# Patient Record
Sex: Male | Born: 1985
Health system: Southern US, Community
[De-identification: ages and names within clinical notes are randomized; demographics above are authoritative.]

## PROBLEM LIST (undated history)

## (undated) HISTORY — PX: WRIST FUSION: SHX839

---
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
## 2009-08-19 ENCOUNTER — Inpatient Hospital Stay (HOSPITAL_COMMUNITY)
Admission: EM | Admit: 2009-08-19 | Discharge: 2009-08-21 | Payer: Self-pay | Source: Home / Self Care | Admitting: Emergency Medicine

## 2010-04-15 LAB — BASIC METABOLIC PANEL
CO2: 28 mEq/L (ref 19–32)
Chloride: 101 mEq/L (ref 96–112)
GFR calc Af Amer: >60 mL/min (ref >60)
GFR calc non Af Amer: >60 mL/min (ref >60)
Sodium: 135 mEq/L (ref 135–145)

## 2010-04-15 LAB — CBC
HCT: 37.1 % — ABNORMAL LOW (ref 39.0–52.0)
HCT: 37.4 % — ABNORMAL LOW (ref 39.0–52.0)
Hemoglobin: 12.8 g/dL — ABNORMAL LOW (ref 13.0–17.0)
Hemoglobin: 12.9 g/dL — ABNORMAL LOW (ref 13.0–17.0)
Hemoglobin: 15.2 g/dL (ref 13.0–17.0)
MCH: 30.3 pg (ref 26.0–34.0)
MCH: 30.3 pg (ref 26.0–34.0)
MCH: 31 pg (ref 26.0–34.0)
MCHC: 34.3 g/dL (ref 30.0–36.0)
MCHC: 34.3 g/dL (ref 30.0–36.0)
MCHC: 34.9 g/dL (ref 30.0–36.0)
MCV: 88.4 fL (ref 78.0–100.0)
Platelets: 187 10*3/uL (ref 150–400)
RBC: 4.24 MIL/uL (ref 4.22–5.81)
RDW: 12.2 % (ref 11.5–15.5)
RDW: 12.4 % (ref 11.5–15.5)
RDW: 12.4 % (ref 11.5–15.5)

## 2010-04-15 LAB — COMPREHENSIVE METABOLIC PANEL
ALT: 178 U/L — ABNORMAL HIGH (ref 0–53)
CO2: 24 mEq/L (ref 19–32)
Calcium: 9.2 mg/dL (ref 8.4–10.5)
Creatinine, Ser: 1.07 mg/dL (ref 0.4–1.5)
Total Protein: 7.4 g/dL (ref 6.0–8.3)

## 2010-04-15 LAB — LACTIC ACID, PLASMA: Lactic Acid, Venous: 2.9 mmol/L — ABNORMAL HIGH (ref 0.5–2.2)

## 2010-04-15 LAB — SAMPLE TO BLOOD BANK

## 2010-04-15 LAB — POCT I-STAT, CHEM 8
Calcium, Ion: 1.1 mmol/L — ABNORMAL LOW (ref 1.12–1.32)
Glucose, Bld: 163 mg/dL — ABNORMAL HIGH (ref 70–99)
Potassium: 3.6 mEq/L (ref 3.5–5.1)
TCO2: 20 mmol/L (ref 0–100)

## 2010-04-15 LAB — PROTIME-INR: INR: 1.26 (ref 0.00–1.49)

## 2010-04-15 LAB — APTT: aPTT: 27 seconds (ref 24–37)
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
## 2012-03-07 ENCOUNTER — Other Ambulatory Visit (HOSPITAL_BASED_OUTPATIENT_CLINIC_OR_DEPARTMENT_OTHER): Payer: Self-pay | Admitting: Family Medicine

## 2012-03-07 DIAGNOSIS — R229 Localized swelling, mass and lump, unspecified: Secondary | ICD-10-CM

## 2012-03-10 ENCOUNTER — Ambulatory Visit (HOSPITAL_BASED_OUTPATIENT_CLINIC_OR_DEPARTMENT_OTHER): Payer: BC Managed Care – PPO

## 2012-04-10 ENCOUNTER — Other Ambulatory Visit (HOSPITAL_BASED_OUTPATIENT_CLINIC_OR_DEPARTMENT_OTHER): Payer: Self-pay | Admitting: Family Medicine

## 2012-04-10 DIAGNOSIS — R229 Localized swelling, mass and lump, unspecified: Secondary | ICD-10-CM

## 2012-04-11 ENCOUNTER — Ambulatory Visit (HOSPITAL_BASED_OUTPATIENT_CLINIC_OR_DEPARTMENT_OTHER)
Admission: RE | Admit: 2012-04-11 | Discharge: 2012-04-11 | Disposition: A | Discharge status: Home / Self Care | Payer: BC Managed Care – PPO | Source: Ambulatory Visit | Attending: Family Medicine | Admitting: Family Medicine

## 2012-04-11 DIAGNOSIS — R229 Localized swelling, mass and lump, unspecified: Secondary | ICD-10-CM | POA: Insufficient documentation

## 2015-04-12 ENCOUNTER — Other Ambulatory Visit: Payer: Self-pay | Admitting: Physician Assistant

## 2015-04-12 DIAGNOSIS — R590 Localized enlarged lymph nodes: Secondary | ICD-10-CM

## 2019-07-17 ENCOUNTER — Other Ambulatory Visit: Payer: Self-pay

## 2019-07-17 ENCOUNTER — Encounter (HOSPITAL_BASED_OUTPATIENT_CLINIC_OR_DEPARTMENT_OTHER): Payer: Self-pay | Admitting: *Deleted

## 2019-07-17 ENCOUNTER — Emergency Department (HOSPITAL_BASED_OUTPATIENT_CLINIC_OR_DEPARTMENT_OTHER): Payer: Self-pay

## 2019-07-17 ENCOUNTER — Emergency Department (HOSPITAL_BASED_OUTPATIENT_CLINIC_OR_DEPARTMENT_OTHER)
Admission: EM | Admit: 2019-07-17 | Discharge: 2019-07-17 | Disposition: A | Discharge status: Home / Self Care | Payer: Self-pay | Attending: Emergency Medicine | Admitting: Emergency Medicine

## 2019-07-17 DIAGNOSIS — I10 Essential (primary) hypertension: Secondary | ICD-10-CM | POA: Insufficient documentation

## 2019-07-17 DIAGNOSIS — R059 Cough, unspecified: Secondary | ICD-10-CM

## 2019-07-17 DIAGNOSIS — I160 Hypertensive urgency: Secondary | ICD-10-CM | POA: Insufficient documentation

## 2019-07-17 DIAGNOSIS — G43009 Migraine without aura, not intractable, without status migrainosus: Secondary | ICD-10-CM | POA: Insufficient documentation

## 2019-07-17 DIAGNOSIS — N179 Acute kidney failure, unspecified: Secondary | ICD-10-CM | POA: Insufficient documentation

## 2019-07-17 LAB — CBC WITH DIFFERENTIAL/PLATELET
Abs Immature Granulocytes: 0.01 10*3/uL (ref 0.00–0.07)
Basophils Absolute: 0 10*3/uL (ref 0.0–0.1)
Basophils Relative: 1 %
Eosinophils Absolute: 0.2 10*3/uL (ref 0.0–0.5)
Eosinophils Relative: 3 %
HCT: 35.5 % — ABNORMAL LOW (ref 39.0–52.0)
Hemoglobin: 12.1 g/dL — ABNORMAL LOW (ref 13.0–17.0)
Immature Granulocytes: 0 %
Lymphocytes Relative: 23 %
Lymphs Abs: 1.2 10*3/uL (ref 0.7–4.0)
MCH: 27.1 pg (ref 26.0–34.0)
MCHC: 34.1 g/dL (ref 30.0–36.0)
MCV: 79.6 fL — ABNORMAL LOW (ref 80.0–100.0)
Monocytes Absolute: 0.6 10*3/uL (ref 0.1–1.0)
Monocytes Relative: 11 %
Neutro Abs: 3.3 10*3/uL (ref 1.7–7.7)
Neutrophils Relative %: 62 %
Platelets: 233 10*3/uL (ref 150–400)
RBC: 4.46 MIL/uL (ref 4.22–5.81)
RDW: 14.6 % (ref 11.5–15.5)
WBC: 5.3 10*3/uL (ref 4.0–10.5)
nRBC: 0 % (ref 0.0–0.2)

## 2019-07-17 LAB — COMPREHENSIVE METABOLIC PANEL
ALT: 22 U/L (ref 0–44)
AST: 22 U/L (ref 15–41)
Albumin: 4.2 g/dL (ref 3.5–5.0)
Alkaline Phosphatase: 158 U/L — ABNORMAL HIGH (ref 38–126)
Anion gap: 11 (ref 5–15)
BUN: 18 mg/dL (ref 6–20)
CO2: 24 mmol/L (ref 22–32)
Calcium: 12.5 mg/dL — ABNORMAL HIGH (ref 8.9–10.3)
Chloride: 103 mmol/L (ref 98–111)
Creatinine, Ser: 2.37 mg/dL — ABNORMAL HIGH (ref 0.61–1.24)
GFR calc Af Amer: 40 mL/min — ABNORMAL LOW (ref >60)
GFR calc non Af Amer: 34 mL/min — ABNORMAL LOW (ref >60)
Glucose, Bld: 90 mg/dL (ref 70–99)
Potassium: 3.6 mmol/L (ref 3.5–5.1)
Sodium: 138 mmol/L (ref 135–145)
Total Bilirubin: 0.6 mg/dL (ref 0.3–1.2)
Total Protein: 9.8 g/dL — ABNORMAL HIGH (ref 6.5–8.1)

## 2019-07-17 LAB — VITAMIN D 25 HYDROXY (VIT D DEFICIENCY, FRACTURES): Vit D, 25-Hydroxy: 21.09 ng/mL — ABNORMAL LOW (ref 30–100)

## 2019-07-17 LAB — URINALYSIS, ROUTINE W REFLEX MICROSCOPIC
Bilirubin Urine: NEGATIVE
Glucose, UA: NEGATIVE mg/dL
Hgb urine dipstick: NEGATIVE
Ketones, ur: NEGATIVE mg/dL
Leukocytes,Ua: NEGATIVE
Nitrite: NEGATIVE
Protein, ur: NEGATIVE mg/dL
Specific Gravity, Urine: 1.01 (ref 1.005–1.030)
pH: 7 (ref 5.0–8.0)

## 2019-07-17 LAB — GAMMA GT: GGT: 80 U/L — ABNORMAL HIGH (ref 7–50)

## 2019-07-17 LAB — TROPONIN I (HIGH SENSITIVITY): Troponin I (High Sensitivity): 7 ng/L (ref <18)

## 2019-07-17 MED ORDER — LABETALOL HCL 5 MG/ML IV SOLN
10.0000 mg | Freq: Once | INTRAVENOUS | Status: AC
  Administered 2019-07-17: 10 mg via INTRAVENOUS
  Filled 2019-07-17: qty 4

## 2019-07-17 MED ORDER — AMLODIPINE BESYLATE 5 MG PO TABS
5.0000 mg | ORAL_TABLET | Freq: Every day | ORAL | 0 refills | Status: DC
Start: 2019-07-17 — End: 2019-07-22

## 2019-07-17 NOTE — Discharge Instructions (Addendum)
It was wonderful to meet you today.  You were seen in the ED due to significantly elevated blood pressure, high calcium, and kidney injury.  Your CT head was normal and your chest x-ray shows some thickening of your airways in the center on both sides.  We do still recommend that you be admitted for further evaluation and work-up.  Please make sure you follow-up with your PCP on Monday to continue further evaluation.  We have sent in a blood pressure medication, please make sure that you take this daily.  Also check your blood pressure 1-2 times daily over the weekend and keep a journal of this.  If you have any shortness of breath, chest pain, headache not improved with Tylenol, lightheadedness/dizziness, muscle spasms, decreased urine output, or any weakness/numbness please make sure you return to evaluation sooner than your follow-up.  Please avoid any NSAIDs including ibuprofen, Motrin, Aleve.  Stop taking the hydrochlorothiazide. Stay hydrated.

## 2019-07-17 NOTE — ED Triage Notes (Signed)
Pt sent here by PMD office with abnormal labs from this am. C/o generalized fatigue and cough .

## 2019-07-17 NOTE — ED Notes (Signed)
Patient transported to CT 

## 2019-07-17 NOTE — ED Provider Notes (Signed)
[facility] EMERGENCY DEPARTMENT Provider Note   CSN: [id number] Arrival date & time: 07/17/19  1822     History Chief Complaint  Patient presents with  . Hypertension    Phillip Kim is a 34 y.o. male presenting for evaluation of abnormal labs after being seen by his primary care provider yesterday morning.  Labs collected this morning.  Reports he was informed of new anemia, renal dysfunction, and calcium abnormalities.  Reports an approximate 1 month history of generalized fatigue and malaise with frequent right-side migraines.  He had also had an occasionally productive cough since January that has improved recently, of minimal concern to him now.  He has been having about 3 or so migraines a week, the at the end of the day and throbbing in nature.  Associated nausea, photophobia, phonophobia if he does not take ibuprofen quickly.  He has been taking ibuprofen 400 mg numerous days a week for the past month.  For the last 2 weeks he has noticed bilateral ankle swelling.  Urinating as normal several times a day.  He was seen by his primary care provider for these concerns yesterday.  His blood pressure was significantly elevated that time as well.  Not previously diagnosed with hypertension and had not checked his blood pressure in several months prior to.  He denies any chest pain, shortness of breath, visual changes, numbness/tingling, muscle spasms, easy bruising, unexpected weight loss, or fever.  Has been having bilateral elbow joint pains.  No known family history of renal disease or malignancy.  Non-smoker and occasionally drinks alcohol.  Denies any illicit drug use.  Sexually active in a monogamous marriage.    History reviewed. No pertinent past medical history.  There are no problems to display for this patient.   Past Surgical History:  Procedure Laterality Date  . WRIST FUSION         History reviewed. No pertinent family history.  Social History    Tobacco Use  . Smoking status: Never Smoker  . Smokeless tobacco: Never Used  Vaping Use  . Vaping Use: Never used  Substance Use Topics  . Alcohol use: Not Currently    Comment: soc  . Drug use: Not Currently    Home Medications Prior to Admission medications   Medication Sig Start Date End Date Taking? Authorizing Provider  amLODipine (NORVASC) 5 MG tablet Take 1 tablet (5 mg total) by mouth daily. Can increase in the 2-3 times to two tablets. 07/17/19 08/16/19  Allayne Stack, DO    Allergies    Patient has no known allergies.  Review of Systems   Review of Systems  Respiratory: Positive for cough. Negative for chest tightness and shortness of breath.   Cardiovascular: Positive for leg swelling. Negative for chest pain and palpitations.  Gastrointestinal: Negative for abdominal pain, blood in stool, constipation, diarrhea and vomiting.  Genitourinary: Negative for dysuria and hematuria.  Musculoskeletal: Positive for arthralgias.  Neurological: Positive for headaches. Negative for dizziness, seizures, syncope, weakness, light-headedness and numbness.  Hematological: Negative for adenopathy. Does not bruise/bleed easily.  Psychiatric/Behavioral: Negative for behavioral problems.    Physical Exam Updated Vital Signs BP (!) 169/120 (BP Location: Right Arm)   Pulse 83   Temp 98.6 F (37 C) (Oral)   Resp 14   Ht 6\' 2"  (1.88 m)   Wt 78.9 kg   SpO2 99%   BMI 22.34 kg/m   Physical Exam Constitutional:      General: He is not in  acute distress.    Appearance: Normal appearance. He is not toxic-appearing.  HENT:     Head: Normocephalic and atraumatic.     Mouth/Throat:     Mouth: Mucous membranes are moist.  Eyes:     General: No scleral icterus.    Extraocular Movements: Extraocular movements intact.     Pupils: Pupils are equal, round, and reactive to light.  Neck:     Comments: Normal-sized thyroid without palpation of nodules.  Nontender to palpation of  thyroid. Cardiovascular:     Rate and Rhythm: Normal rate and regular rhythm.     Pulses: Normal pulses.     Heart sounds: Normal heart sounds. No murmur heard.  No friction rub.     Comments: No supraclavicular or axillary lymphadenopathy bilaterally. Pulmonary:     Effort: Pulmonary effort is normal.     Breath sounds: Normal breath sounds. No wheezing, rhonchi or rales.  Abdominal:     General: Bowel sounds are normal. There is no distension.     Palpations: Abdomen is soft.     Tenderness: There is no abdominal tenderness. There is no guarding or rebound.  Musculoskeletal:        General: Normal range of motion.     Cervical back: Normal range of motion and neck supple.     Comments: Trace pedal edema bilaterally.  Lymphadenopathy:     Cervical: No cervical adenopathy.  Skin:    General: Skin is warm and dry.     Capillary Refill: Capillary refill takes less than 2 seconds.     Coloration: Skin is not jaundiced.     Findings: No bruising or rash.  Neurological:     General: No focal deficit present.     Mental Status: He is alert and oriented to person, place, and time.  Psychiatric:        Mood and Affect: Mood normal.        Behavior: Behavior normal.     ED Results / Procedures / Treatments   Labs (all labs ordered are listed, but only abnormal results are displayed) Labs Reviewed  CBC WITH DIFFERENTIAL/PLATELET - Abnormal; Notable for the following components:      Result Value   Hemoglobin 12.1 (*)    HCT 35.5 (*)    MCV 79.6 (*)    All other components within normal limits  COMPREHENSIVE METABOLIC PANEL - Abnormal; Notable for the following components:   Creatinine, Ser 2.37 (*)    Calcium 12.5 (*)    Total Protein 9.8 (*)    Alkaline Phosphatase 158 (*)    GFR calc non Af Amer 34 (*)    GFR calc Af Amer 40 (*)    All other components within normal limits  VITAMIN D 25 HYDROXY (VIT D DEFICIENCY, FRACTURES) - Abnormal; Notable for the following components:    Vit D, 25-Hydroxy 21.09 (*)    All other components within normal limits  GAMMA GT - Abnormal; Notable for the following components:   GGT 80 (*)    All other components within normal limits  URINALYSIS, ROUTINE W REFLEX MICROSCOPIC  PARATHYROID HORMONE, INTACT (NO CA)  TROPONIN I (HIGH SENSITIVITY)    EKG EKG Interpretation  Date/Time:  Friday July 17 2019 18:52:53 EDT Ventricular Rate:  93 PR Interval:    QRS Duration: 94 QT Interval:  356 QTC Calculation: 443 R Axis:   78 Text Interpretation: Sinus rhythm Consider left ventricular hypertrophy no prior available for comparison Confirmed by Ralene Bathe,  Lanora Manis [id number]) on 07/17/2019 7:35:18 PM   Radiology DG Chest 2 View  Result Date: 07/17/2019 CLINICAL DATA:  Fatigue and cough. Referred by primary care for abnormal labs. EXAM: CHEST - 2 VIEW COMPARISON:  Radiograph 09/18/2017 FINDINGS: The cardiomediastinal contours are normal. Diffuse peribronchial thickening that is similar to 2019 exam. Pulmonary vasculature is normal. No consolidation, pleural effusion, or pneumothorax. No acute osseous abnormalities are seen. IMPRESSION: Diffuse peribronchial thickening that is chronic and similar to 2019 exam, underlying etiology is uncertain. No new findings. Electronically Signed   By: Narda Rutherford M.D.   On: 07/17/2019 20:13   CT Head Wo Contrast  Result Date: 07/17/2019 CLINICAL DATA:  Intermittent headache for 3 weeks with right eye visual disturbance. EXAM: CT HEAD WITHOUT CONTRAST TECHNIQUE: Contiguous axial images were obtained from the base of the skull through the vertex without intravenous contrast. COMPARISON:  08/20/2009 FINDINGS: Brain: There is no evidence of acute infarct, intracranial hemorrhage, mass, midline shift, or extra-axial fluid collection. The ventricles and sulci are normal. Small subdural hematoma over the tentorium on the 2011 study has resolved. Vascular: No hyperdense vessel. Skull: No fracture or suspicious  osseous lesion. Sinuses/Orbits: Moderate bilateral ethmoid air cell mucosal thickening. Clear mastoid air cells. Unremarkable orbits. Other: None. IMPRESSION: Unremarkable CT appearance of the brain. Electronically Signed   By: Sebastian Ache M.D.   On: 07/17/2019 20:11    Procedures Procedures (including critical care time)  Medications Ordered in ED Medications  labetalol (NORMODYNE) injection 10 mg (10 mg Intravenous Given 07/17/19 2026)    ED Course  I have reviewed the triage vital signs and the nursing notes.  Pertinent labs & imaging results that were available during my care of the patient were reviewed by me and considered in my medical decision making (see chart for details).    MDM Rules/Calculators/A&P                          34 year old gentleman presenting for evaluation of abnormal labs via PCP earlier today, associated with several week history of fatigue and migraines.   Work-up notable for asymptomatic moderate hypercalcemia of 12.5, creatinine 2.37 with unknown baseline, isolated elevated alkaline phosphatase of 158, and mild microcytic anemia.  Renal injury is nonoliguric without evidence of hematuria or proteinuria on U/A to suggest nephritic or nephrotic process.  Notably hypertensive with systolics >180 and diastolics >120, do suspect secondary hypertension.  Received IV labetalol with improvement in BP.  CT head without acute intracranial abnormality.  CXR with peribronchial thickening otherwise no evidence of pneumonia or masslike structure.  EKG sinus with LVH, suggesting hypertension may be more longstanding than suggested.  Due to hypertensive urgency with moderate hypercalcemia and extensive nonoliguric renal injury, recommended admission for further work-up and monitoring. Patient declined and stated that he would follow-up with his PCP on Monday.  Malignancy, hyperparathyroid, renal disease, sarcoid could be considered. Instructed to discontinue HCTZ d/t AKI and  provided with Norvasc prescription.  Monitor BP 1-2 times daily at home and keep a journal.  Avoid all NSAIDs, Tylenol for headaches.   Added GGT, PTH, and vitamin D to work-up, results returned after patient discharged and will call to inform of results tomorrow, 6/19.  GGT elevated, should consider RUQ U/S for further eval.   Final Clinical Impression(s) / ED Diagnoses Final diagnoses:  Hypertensive urgency  AKI (acute kidney injury) (HCC)  Migraine without aura and without status migrainosus, not intractable    Rx /  DC Orders ED Discharge Orders         Ordered    amLODipine (NORVASC) 5 MG tablet  Daily     Discontinue  Reprint     07/17/19 2200           Allayne Stack, DO 07/18/19 [id number]    Tilden Fossa, MD 07/18/19 1120

## 2019-07-18 ENCOUNTER — Telehealth (HOSPITAL_COMMUNITY): Payer: Self-pay | Admitting: Family Medicine

## 2019-07-18 ENCOUNTER — Other Ambulatory Visit: Payer: Self-pay

## 2019-07-18 ENCOUNTER — Encounter (HOSPITAL_COMMUNITY): Payer: Self-pay | Admitting: Emergency Medicine

## 2019-07-18 ENCOUNTER — Inpatient Hospital Stay (HOSPITAL_COMMUNITY): Payer: Self-pay

## 2019-07-18 ENCOUNTER — Inpatient Hospital Stay (HOSPITAL_COMMUNITY)
Admission: EM | Admit: 2019-07-18 | Discharge: 2019-07-22 | DRG: 683 | Disposition: A | Discharge status: Home / Self Care | Payer: Self-pay | Attending: Internal Medicine | Admitting: Internal Medicine

## 2019-07-18 DIAGNOSIS — I5032 Chronic diastolic (congestive) heart failure: Secondary | ICD-10-CM | POA: Diagnosis present

## 2019-07-18 DIAGNOSIS — I10 Essential (primary) hypertension: Secondary | ICD-10-CM | POA: Diagnosis present

## 2019-07-18 DIAGNOSIS — I110 Hypertensive heart disease with heart failure: Secondary | ICD-10-CM | POA: Diagnosis present

## 2019-07-18 DIAGNOSIS — E876 Hypokalemia: Secondary | ICD-10-CM | POA: Diagnosis present

## 2019-07-18 DIAGNOSIS — R161 Splenomegaly, not elsewhere classified: Secondary | ICD-10-CM | POA: Diagnosis present

## 2019-07-18 DIAGNOSIS — I161 Hypertensive emergency: Secondary | ICD-10-CM | POA: Diagnosis present

## 2019-07-18 DIAGNOSIS — Z20822 Contact with and (suspected) exposure to covid-19: Secondary | ICD-10-CM | POA: Diagnosis present

## 2019-07-18 DIAGNOSIS — D649 Anemia, unspecified: Secondary | ICD-10-CM | POA: Diagnosis present

## 2019-07-18 DIAGNOSIS — I160 Hypertensive urgency: Secondary | ICD-10-CM | POA: Diagnosis present

## 2019-07-18 DIAGNOSIS — N179 Acute kidney failure, unspecified: Principal | ICD-10-CM | POA: Diagnosis present

## 2019-07-18 DIAGNOSIS — R519 Headache, unspecified: Secondary | ICD-10-CM

## 2019-07-18 DIAGNOSIS — R7989 Other specified abnormal findings of blood chemistry: Secondary | ICD-10-CM

## 2019-07-18 DIAGNOSIS — R918 Other nonspecific abnormal finding of lung field: Secondary | ICD-10-CM | POA: Diagnosis present

## 2019-07-18 LAB — CBC WITH DIFFERENTIAL/PLATELET
Abs Immature Granulocytes: 0.02 10*3/uL (ref 0.00–0.07)
Basophils Absolute: 0 10*3/uL (ref 0.0–0.1)
Basophils Relative: 1 %
Eosinophils Absolute: 0.1 10*3/uL (ref 0.0–0.5)
Eosinophils Relative: 2 %
HCT: 35.3 % — ABNORMAL LOW (ref 39.0–52.0)
Hemoglobin: 12 g/dL — ABNORMAL LOW (ref 13.0–17.0)
Immature Granulocytes: 0 %
Lymphocytes Relative: 16 %
Lymphs Abs: 0.8 10*3/uL (ref 0.7–4.0)
MCH: 27.1 pg (ref 26.0–34.0)
MCHC: 34 g/dL (ref 30.0–36.0)
MCV: 79.9 fL — ABNORMAL LOW (ref 80.0–100.0)
Monocytes Absolute: 0.5 10*3/uL (ref 0.1–1.0)
Monocytes Relative: 9 %
Neutro Abs: 3.6 10*3/uL (ref 1.7–7.7)
Neutrophils Relative %: 72 %
Platelets: 232 10*3/uL (ref 150–400)
RBC: 4.42 MIL/uL (ref 4.22–5.81)
RDW: 14.7 % (ref 11.5–15.5)
WBC: 5 10*3/uL (ref 4.0–10.5)
nRBC: 0 % (ref 0.0–0.2)

## 2019-07-18 LAB — BASIC METABOLIC PANEL
Anion gap: 6 (ref 5–15)
BUN: 17 mg/dL (ref 6–20)
CO2: 27 mmol/L (ref 22–32)
Calcium: 13.5 mg/dL (ref 8.9–10.3)
Chloride: 106 mmol/L (ref 98–111)
Creatinine, Ser: 2.45 mg/dL — ABNORMAL HIGH (ref 0.61–1.24)
GFR calc Af Amer: 38 mL/min — ABNORMAL LOW (ref >60)
GFR calc non Af Amer: 33 mL/min — ABNORMAL LOW (ref >60)
Glucose, Bld: 95 mg/dL (ref 70–99)
Potassium: 3 mmol/L — ABNORMAL LOW (ref 3.5–5.1)
Sodium: 139 mmol/L (ref 135–145)

## 2019-07-18 LAB — SARS CORONAVIRUS 2 BY RT PCR (HOSPITAL ORDER, PERFORMED IN ~~LOC~~ HOSPITAL LAB): SARS Coronavirus 2: NEGATIVE

## 2019-07-18 MED ORDER — ACETAMINOPHEN 325 MG PO TABS
650.0000 mg | ORAL_TABLET | Freq: Four times a day (QID) | ORAL | Status: DC | PRN
  Administered 2019-07-18: 650 mg via ORAL
  Filled 2019-07-18: qty 2

## 2019-07-18 MED ORDER — HYDROMORPHONE HCL 1 MG/ML IJ SOLN
0.5000 mg | INTRAMUSCULAR | Status: DC | PRN
  Filled 2019-07-18: qty 1

## 2019-07-18 MED ORDER — LABETALOL HCL 5 MG/ML IV SOLN
10.0000 mg | Freq: Once | INTRAVENOUS | Status: AC
  Administered 2019-07-18: 10 mg via INTRAVENOUS
  Filled 2019-07-18: qty 4

## 2019-07-18 MED ORDER — HYDRALAZINE HCL 25 MG PO TABS
25.0000 mg | ORAL_TABLET | Freq: Four times a day (QID) | ORAL | Status: DC
  Administered 2019-07-18 – 2019-07-21 (×11): 25 mg via ORAL
  Filled 2019-07-18 (×12): qty 1

## 2019-07-18 MED ORDER — POTASSIUM CHLORIDE CRYS ER 20 MEQ PO TBCR
40.0000 meq | EXTENDED_RELEASE_TABLET | Freq: Once | ORAL | Status: AC
  Administered 2019-07-18: 40 meq via ORAL
  Filled 2019-07-18: qty 2

## 2019-07-18 MED ORDER — SODIUM CHLORIDE 0.9 % IV SOLN: INTRAVENOUS | Status: DC

## 2019-07-18 MED ORDER — CARVEDILOL 3.125 MG PO TABS
3.1250 mg | ORAL_TABLET | Freq: Two times a day (BID) | ORAL | Status: DC
  Filled 2019-07-18: qty 1

## 2019-07-18 MED ORDER — CARVEDILOL 3.125 MG PO TABS
3.1250 mg | ORAL_TABLET | Freq: Two times a day (BID) | ORAL | Status: DC
  Administered 2019-07-18 – 2019-07-19 (×3): 3.125 mg via ORAL
  Filled 2019-07-18 (×3): qty 1

## 2019-07-18 MED ORDER — HYDRALAZINE HCL 25 MG PO TABS
25.0000 mg | ORAL_TABLET | Freq: Four times a day (QID) | ORAL | Status: DC | PRN
  Administered 2019-07-19: 25 mg via ORAL

## 2019-07-18 MED ORDER — BUTALBITAL-APAP-CAFFEINE 50-325-40 MG PO TABS
1.0000 | ORAL_TABLET | Freq: Four times a day (QID) | ORAL | Status: DC | PRN
  Administered 2019-07-19 – 2019-07-20 (×4): 1 via ORAL
  Filled 2019-07-18 (×6): qty 1

## 2019-07-18 MED ORDER — SODIUM CHLORIDE 0.9 % IV SOLN: INTRAVENOUS | Status: AC

## 2019-07-18 MED ORDER — AMLODIPINE BESYLATE 5 MG PO TABS
5.0000 mg | ORAL_TABLET | Freq: Two times a day (BID) | ORAL | Status: DC
  Administered 2019-07-18 – 2019-07-22 (×8): 5 mg via ORAL
  Filled 2019-07-18 (×8): qty 1

## 2019-07-18 MED ORDER — ONDANSETRON HCL 4 MG/2ML IJ SOLN
4.0000 mg | Freq: Once | INTRAMUSCULAR | Status: AC
  Administered 2019-07-18: 4 mg via INTRAVENOUS
  Filled 2019-07-18: qty 2

## 2019-07-18 NOTE — ED Provider Notes (Signed)
[place] EMERGENCY DEPARTMENT Provider Note   CSN: [id number] Arrival date & time: 07/18/19  1347     History Chief Complaint  Patient presents with  . Hypertension    Phillip Kim is a 34 y.o. male.  HPI   Patient presents to the emergency department with chief complaint of headaches nausea and vomiting.  Patient states this symptom has been going on for the last 3 weeks patient admits to some epigastric pain, intermittent cough with some phlegm. he was  seen at [facility] last night where he was worked up for hypertensive urgency.  Patient had labs drawn showing elevated creatinine levels, decreased GFR, increased calcium, increased alk phos, and a decreased vitamin D level.  Imaging was performed showing normal head CT as well as a chest x-ray which showed peribronchial thickening similar to x-ray done 2019.  Patient was going to be admitted to the hospital last night but patient left AMA and was instructed to come back if symptoms progress.  Patient is here because symptoms have gotten worse and he would like to be reevaluated.  Patient has no significant medical history, does not take any medication on a daily basis.  Patient denies the use of drugs, alcohol, smoking.  History reviewed. No pertinent past medical history.  There are no problems to display for this patient.   Past Surgical History:  Procedure Laterality Date  . WRIST FUSION         No family history on file.  Social History   Tobacco Use  . Smoking status: Never Smoker  . Smokeless tobacco: Never Used  Vaping Use  . Vaping Use: Never used  Substance Use Topics  . Alcohol use: Not Currently    Comment: soc  . Drug use: Not Currently    Home Medications Prior to Admission medications   Medication Sig Start Date End Date Taking? Authorizing Provider  amLODipine (NORVASC) 5 MG tablet Take 1 tablet (5 mg total) by mouth daily. Can increase in the 2-3 times to two  tablets. 07/17/19 08/16/19  Patriciaann Clan, DO    Allergies    Patient has no known allergies.  Review of Systems   Review of Systems  Constitutional: Negative for chills and fever.  HENT: Negative for congestion and sore throat.   Eyes: Negative for pain and visual disturbance.  Respiratory: Positive for cough. Negative for shortness of breath.   Cardiovascular: Negative for chest pain.  Gastrointestinal: Positive for abdominal pain, nausea and vomiting. Negative for diarrhea.  Genitourinary: Negative for enuresis, flank pain, frequency and testicular pain.  Musculoskeletal: Negative for back pain.  Skin: Negative for rash.  Neurological: Positive for headaches. Negative for dizziness.  Hematological: Does not bruise/bleed easily.    Physical Exam Updated Vital Signs BP (!) 173/125 (BP Location: Right Arm)   Pulse 96   Temp 98.4 F (36.9 C) (Oral)   Resp 16   SpO2 98%   Physical Exam Vitals and nursing note reviewed.  Constitutional:      General: He is not in acute distress.    Appearance: Normal appearance. He is not ill-appearing or diaphoretic.  HENT:     Head: Normocephalic and atraumatic.     Nose: No congestion or rhinorrhea.     Mouth/Throat:     Mouth: Mucous membranes are moist.     Pharynx: Oropharynx is clear.  Eyes:     General: No visual field deficit or scleral icterus.    Conjunctiva/sclera:  Conjunctivae normal.     Pupils: Pupils are equal, round, and reactive to light.  Cardiovascular:     Rate and Rhythm: Normal rate and regular rhythm.     Pulses: Normal pulses.     Heart sounds: No murmur heard.  No friction rub. No gallop.   Pulmonary:     Effort: Pulmonary effort is normal. No respiratory distress.     Breath sounds: No wheezing, rhonchi or rales.  Abdominal:     General: There is no distension.     Palpations: Abdomen is soft.     Tenderness: There is no abdominal tenderness. There is no guarding.     Comments: Patient's abdomen was  nondistended, normoactive bowel sounds, dull to percussion, tenderness to palpation in the epigastric area does not radiate.  There is no acute abdomen, no peritoneal sign.  No rebound tenderness.  Musculoskeletal:        General: No swelling or tenderness.  Skin:    General: Skin is warm and dry.     Findings: No rash.  Neurological:     General: No focal deficit present.     Mental Status: He is alert and oriented to person, place, and time.     GCS: GCS eye subscore is 4. GCS verbal subscore is 5. GCS motor subscore is 6.     Cranial Nerves: Cranial nerves are intact. No cranial nerve deficit or facial asymmetry.     Sensory: Sensation is intact. No sensory deficit.     Motor: Motor function is intact. No weakness or pronator drift.     Coordination: Coordination is intact. Romberg sign negative. Finger-Nose-Finger Test and Heel to [facility] Test normal.  Psychiatric:        Mood and Affect: Mood normal.     ED Results / Procedures / Treatments   Labs (all labs ordered are listed, but only abnormal results are displayed) Labs Reviewed  CBC WITH DIFFERENTIAL/PLATELET - Abnormal; Notable for the following components:      Result Value   Hemoglobin 12.0 (*)    HCT 35.3 (*)    MCV 79.9 (*)    All other components within normal limits  BASIC METABOLIC PANEL - Abnormal; Notable for the following components:   Potassium 3.0 (*)    Creatinine, Ser 2.45 (*)    Calcium 13.5 (*)    GFR calc non Af Amer 33 (*)    GFR calc Af Amer 38 (*)    All other components within normal limits  SARS CORONAVIRUS 2 BY RT PCR (HOSPITAL ORDER, [place] [facility])  CALCIUM, URINE, RANDOM  CREATININE, URINE, RANDOM  SODIUM, URINE, RANDOM  BASIC METABOLIC PANEL  PHOSPHORUS    EKG None  Radiology DG Chest 2 View  Result Date: 07/17/2019 CLINICAL DATA:  Fatigue and cough. Referred by primary care for abnormal labs. EXAM: CHEST - 2 VIEW COMPARISON:  Radiograph 09/18/2017 FINDINGS: The  cardiomediastinal contours are normal. Diffuse peribronchial thickening that is similar to 2019 exam. Pulmonary vasculature is normal. No consolidation, pleural effusion, or pneumothorax. No acute osseous abnormalities are seen. IMPRESSION: Diffuse peribronchial thickening that is chronic and similar to 2019 exam, underlying etiology is uncertain. No new findings. Electronically Signed   By: Keith Rake M.D.   On: 07/17/2019 20:13   CT Head Wo Contrast  Result Date: 07/17/2019 CLINICAL DATA:  Intermittent headache for 3 weeks with right eye visual disturbance. EXAM: CT HEAD WITHOUT CONTRAST TECHNIQUE: Contiguous axial images were obtained from  the base of the skull through the vertex without intravenous contrast. COMPARISON:  08/20/2009 FINDINGS: Brain: There is no evidence of acute infarct, intracranial hemorrhage, mass, midline shift, or extra-axial fluid collection. The ventricles and sulci are normal. Small subdural hematoma over the tentorium on the 2011 study has resolved. Vascular: No hyperdense vessel. Skull: No fracture or suspicious osseous lesion. Sinuses/Orbits: Moderate bilateral ethmoid air cell mucosal thickening. Clear mastoid air cells. Unremarkable orbits. Other: None. IMPRESSION: Unremarkable CT appearance of the brain. Electronically Signed   By: Logan Bores M.D.   On: 07/17/2019 20:11    Procedures Procedures (including critical care time)  Medications Ordered in ED Medications  0.9 %  sodium chloride infusion (has no administration in time range)  acetaminophen (TYLENOL) tablet 650 mg (has no administration in time range)  hydrALAZINE (APRESOLINE) tablet 25 mg (has no administration in time range)  amLODipine (NORVASC) tablet 5 mg (has no administration in time range)  carvedilol (COREG) tablet 3.125 mg (has no administration in time range)  0.9 %  sodium chloride infusion (has no administration in time range)  potassium chloride SA (KLOR-CON) CR tablet 40 mEq (has no  administration in time range)  labetalol (NORMODYNE) injection 10 mg (10 mg Intravenous Given 07/18/19 1653)  ondansetron (ZOFRAN) injection 4 mg (4 mg Intravenous Given 07/18/19 1652)    ED Course  I have reviewed the triage vital signs and the nursing notes.  Pertinent labs & imaging results that were available during my care of the patient were reviewed by me and considered in my medical decision making (see chart for details).    MDM Rules/Calculators/A&P                          I have personally reviewed all imaging, labs and have interpreted them.  Concern for hypertensive urgency possibly due to secondary hypertension.  Differential includes hyperparathyroid versus renal artery stenosis versus primary hyperaldosteronism.  Unlikely that patient's pain is result of a head bleed or CVA as patient neuro exam was normal no deficits noted, imaging was done yesterday came back unremarkable.  We will get basic labs, Covid test, give labetalol for blood pressure control as well as Zofran for nausea control.  Patient will be given Tylenol for headache.  Will consult unassigned hospitalist for admission for further evaluation.  Spoke with Dr. Wynetta Fines who has evaluated the patient and will admit the patient for further eval and management. Final Clinical Impression(s) / ED Diagnoses Final diagnoses:  Hypertensive urgency  Acute nonintractable headache, unspecified headache type    Rx / DC Orders ED Discharge Orders    None       Aron Baba 07/18/19 Harlan Stains, MD 07/19/19 [phone]

## 2019-07-18 NOTE — ED Provider Notes (Signed)
Medical screening examination/treatment/procedure(s) were conducted as a shared visit with non-physician practitioner(s) and myself.  I personally evaluated the patient during the encounter.    34 year old male presents due to worsening hypertension as well as signs of endorgan damage.  Patient seen at outside hospital for similar symptoms but did not want to be admitted.  Seen here today and blood pressure remains elevated.  Renal function is worsening.  Is agreeable to admission here and was treated with IV labetalol for his hypertension.   Lorre Nick, MD 07/18/19 1745

## 2019-07-18 NOTE — Telephone Encounter (Signed)
Called patient and wife to discuss results from yesterday evening ED visit that resulted after discharge. GGT elevated at 80, vitamin D insufficiency at 21. PTH still pending. Currently in [facility] ED for repeat evaluation as he has had worsening HA, nausea, and emesis since yesterday.   If repeat alk phos still elevated, could consider RUQ U/S for further evaluation. Should receive vitamin D supplementation through primary provider.   Patriciaann Clan, DO

## 2019-07-18 NOTE — ED Triage Notes (Signed)
Pt states he was seen at [facility] last night after PCP told him to go for abnormal labs.  States [facility] told him to come to [facility] "to have kidneys checked". Reports intermittent headache x 2 weeks and hypertension.

## 2019-07-18 NOTE — H&P (Signed)
History and Physical    Phillip Kim QQV:[id number] DOB: Mar 08, 1985 DOA: 07/18/2019  PCP: Christa See, FNP (Confirm with patient/family/NH records and if not entered, this has to be entered at [facility] point of entry) Patient coming from: Home  I have personally briefly reviewed patient's old medical records in [place]  Chief Complaint: Headche  HPI: Phillip Kim is a 34 y.o. male with no significant medical history presented with new onset of headaches nausea and vomiting.  Symptom started about 2-3 weeks with initially epigastric pain, intermittent nausea. He went to [facility] last night where he was worked up for hypertensive urgency.  Work-up found he had AKI, and hypercalcemia, but patient left AMA and was instructed to come back if symptoms progress.  Patient came for symptoms have gotten worse and he would like to be reevaluated.  Patient denies the use of drugs, alcohol, smoking.   ED Course: Significant elevation of blood pressure, creatinine 2.3 compared to 0.9 10 years ago  Review of Systems: As per HPI otherwise 10 point review of systems negative.    History reviewed. No pertinent past medical history.  Past Surgical History:  Procedure Laterality Date  . WRIST FUSION       reports that he has never smoked. He has never used smokeless tobacco. He reports previous alcohol use. He reports previous drug use.  No Known Allergies  No family history on file.   Prior to Admission medications   Medication Sig Start Date End Date Taking? Authorizing Provider  ibuprofen (ADVIL) 200 MG tablet Take 400 mg by mouth at bedtime as needed (pain).   Yes [provider]  amLODipine (NORVASC) 5 MG tablet Take 1 tablet (5 mg total) by mouth daily. Can increase in the 2-3 times to two tablets. 07/17/19 08/16/19  Patriciaann Clan, DO    Physical Exam: Vitals:   07/18/19 1830 07/18/19 1845 07/18/19 1857 07/18/19 2004  BP: (!) 164/124 (!) 177/112 (!) 177/112 (!)  158/106  Pulse: 86 91 86 83  Resp: 17 15 17 14   Temp:    98.2 F (36.8 C)  TempSrc:    Oral  SpO2: 100% 97% 99% 98%    Constitutional: NAD, calm, comfortable Vitals:   07/18/19 1830 07/18/19 1845 07/18/19 1857 07/18/19 2004  BP: (!) 164/124 (!) 177/112 (!) 177/112 (!) 158/106  Pulse: 86 91 86 83  Resp: 17 15 17 14   Temp:    98.2 F (36.8 C)  TempSrc:    Oral  SpO2: 100% 97% 99% 98%   Eyes: PERRL, lids and conjunctivae normal ENMT: Mucous membranes are dry. Posterior pharynx clear of any exudate or lesions.Normal dentition.  Neck: normal, supple, no masses, no thyromegaly Respiratory: clear to auscultation bilaterally, no wheezing, no crackles. Normal respiratory effort. No accessory muscle use.  Cardiovascular: Regular rate and rhythm, no murmurs / rubs / gallops. No extremity edema. 2+ pedal pulses. No carotid bruits.  Abdomen: no tenderness, no masses palpated. No hepatosplenomegaly. Bowel sounds positive.  Musculoskeletal: no clubbing / cyanosis. No joint deformity upper and lower extremities. Good ROM, no contractures. Normal muscle tone.  Skin: no rashes, lesions, ulcers. No induration Neurologic: CN 2-12 grossly intact. Sensation intact, DTR normal. Strength 5/5 in all 4.  Psychiatric: Normal judgment and insight. Alert and oriented x 3. Normal mood.     Labs on Admission: I have personally reviewed following labs and imaging studies  CBC: Recent Labs  Lab 07/17/19 1851 07/18/19 1650  WBC 5.3 5.0  NEUTROABS 3.3 3.6  HGB 12.1* 12.0*  HCT 35.5* 35.3*  MCV 79.6* 79.9*  PLT 233 232   Basic Metabolic Panel: Recent Labs  Lab 07/17/19 1851 07/18/19 1650  NA 138 139  K 3.6 3.0*  CL 103 106  CO2 24 27  GLUCOSE 90 95  BUN 18 17  CREATININE 2.37* 2.45*  CALCIUM 12.5* 13.5*   GFR: Estimated Creatinine Clearance: 47 mL/min (A) (by C-G formula based on SCr of 2.45 mg/dL (H)). Liver Function Tests: Recent Labs  Lab 07/17/19 1851  AST 22  ALT 22  ALKPHOS  158*  BILITOT 0.6  PROT 9.8*  ALBUMIN 4.2   No results for input(s): LIPASE, AMYLASE in the last 168 hours. No results for input(s): AMMONIA in the last 168 hours. Coagulation Profile: No results for input(s): INR, PROTIME in the last 168 hours. Cardiac Enzymes: No results for input(s): CKTOTAL, CKMB, CKMBINDEX, TROPONINI in the last 168 hours. BNP (last 3 results) No results for input(s): PROBNP in the last 8760 hours. HbA1C: No results for input(s): HGBA1C in the last 72 hours. CBG: No results for input(s): GLUCAP in the last 168 hours. Lipid Profile: No results for input(s): CHOL, HDL, LDLCALC, TRIG, CHOLHDL, LDLDIRECT in the last 72 hours. Thyroid Function Tests: No results for input(s): TSH, T4TOTAL, FREET4, T3FREE, THYROIDAB in the last 72 hours. Anemia Panel: No results for input(s): VITAMINB12, FOLATE, FERRITIN, TIBC, IRON, RETICCTPCT in the last 72 hours. Urine analysis:    Component Value Date/Time   COLORURINE YELLOW 07/17/2019 2026   APPEARANCEUR CLEAR 07/17/2019 2026   LABSPEC 1.010 07/17/2019 2026   PHURINE 7.0 07/17/2019 2026   GLUCOSEU NEGATIVE 07/17/2019 2026   HGBUR NEGATIVE 07/17/2019 2026   BILIRUBINUR NEGATIVE 07/17/2019 2026   KETONESUR NEGATIVE 07/17/2019 2026   PROTEINUR NEGATIVE 07/17/2019 2026   NITRITE NEGATIVE 07/17/2019 2026   LEUKOCYTESUR NEGATIVE 07/17/2019 2026    Radiological Exams on Admission: DG Chest 2 View  Result Date: 07/17/2019 CLINICAL DATA:  Fatigue and cough. Referred by primary care for abnormal labs. EXAM: CHEST - 2 VIEW COMPARISON:  Radiograph 09/18/2017 FINDINGS: The cardiomediastinal contours are normal. Diffuse peribronchial thickening that is similar to 2019 exam. Pulmonary vasculature is normal. No consolidation, pleural effusion, or pneumothorax. No acute osseous abnormalities are seen. IMPRESSION: Diffuse peribronchial thickening that is chronic and similar to 2019 exam, underlying etiology is uncertain. No new findings.  Electronically Signed   By: Narda Rutherford M.D.   On: 07/17/2019 20:13   CT Head Wo Contrast  Result Date: 07/17/2019 CLINICAL DATA:  Intermittent headache for 3 weeks with right eye visual disturbance. EXAM: CT HEAD WITHOUT CONTRAST TECHNIQUE: Contiguous axial images were obtained from the base of the skull through the vertex without intravenous contrast. COMPARISON:  08/20/2009 FINDINGS: Brain: There is no evidence of acute infarct, intracranial hemorrhage, mass, midline shift, or extra-axial fluid collection. The ventricles and sulci are normal. Small subdural hematoma over the tentorium on the 2011 study has resolved. Vascular: No hyperdense vessel. Skull: No fracture or suspicious osseous lesion. Sinuses/Orbits: Moderate bilateral ethmoid air cell mucosal thickening. Clear mastoid air cells. Unremarkable orbits. Other: None. IMPRESSION: Unremarkable CT appearance of the brain. Electronically Signed   By: Sebastian Ache M.D.   On: 07/17/2019 20:11    EKG: Independently reviewed. LVH  Assessment/Plan Active Problems:   HTN (hypertension)   AKI (acute kidney injury) (HCC)  Hypertension emergency -Discussed with on-call nephrologist, start Norvasc twice daily and hydralazine 3 times daily, adjust according to response -  aiming at less than 25% reduction tonight  AKI secondary to hypertensive emergency -Check renal ultrasound, clinically has signs of hypovolemic/dehydration probably secondary to poor oral intake last 3 days -Discussed with nephrologist and recommend slow hydration  Hypercalcemia -Vitamin D level low, parathyroid hormone level pending, check phosphate -Hydration and recheck tomorrow -Discussed with nephrology, check urine electrolytes and urine calcium   DVT prophylaxis: SCD Code Status:Full Family Communication: Wife at bedside Disposition Plan: Plan to control blood pressure in 24 to 48 hours and stabilize kidney function and then discharged so likely will need more  than 2 midnight hospital stay. Consults called: Dr. Arlean Hopping Admission status: Tele admit   Emeline General MD [facility] Pager [phone]    07/18/2019, 8:59 PM

## 2019-07-18 NOTE — Progress Notes (Signed)
Report attempted x 1

## 2019-07-19 ENCOUNTER — Inpatient Hospital Stay (HOSPITAL_COMMUNITY): Payer: Self-pay

## 2019-07-19 ENCOUNTER — Encounter (HOSPITAL_COMMUNITY): Payer: Self-pay | Admitting: Internal Medicine

## 2019-07-19 DIAGNOSIS — I517 Cardiomegaly: Secondary | ICD-10-CM

## 2019-07-19 DIAGNOSIS — N289 Disorder of kidney and ureter, unspecified: Secondary | ICD-10-CM

## 2019-07-19 DIAGNOSIS — R748 Abnormal levels of other serum enzymes: Secondary | ICD-10-CM

## 2019-07-19 LAB — COMPREHENSIVE METABOLIC PANEL
ALT: 22 U/L (ref 0–44)
AST: 23 U/L (ref 15–41)
Albumin: 3.8 g/dL (ref 3.5–5.0)
Alkaline Phosphatase: 144 U/L — ABNORMAL HIGH (ref 38–126)
Anion gap: 8 (ref 5–15)
BUN: 18 mg/dL (ref 6–20)
CO2: 24 mmol/L (ref 22–32)
Calcium: 13.2 mg/dL (ref 8.9–10.3)
Chloride: 106 mmol/L (ref 98–111)
Creatinine, Ser: 2.42 mg/dL — ABNORMAL HIGH (ref 0.61–1.24)
GFR calc Af Amer: 39 mL/min — ABNORMAL LOW (ref >60)
GFR calc non Af Amer: 33 mL/min — ABNORMAL LOW (ref >60)
Glucose, Bld: 100 mg/dL — ABNORMAL HIGH (ref 70–99)
Potassium: 3.4 mmol/L — ABNORMAL LOW (ref 3.5–5.1)
Sodium: 138 mmol/L (ref 135–145)
Total Bilirubin: 0.3 mg/dL (ref 0.3–1.2)
Total Protein: 9.1 g/dL — ABNORMAL HIGH (ref 6.5–8.1)

## 2019-07-19 LAB — SODIUM, URINE, RANDOM: Sodium, Ur: 40 mmol/L

## 2019-07-19 LAB — PROTEIN / CREATININE RATIO, URINE
Creatinine, Urine: 28.4 mg/dL
Protein Creatinine Ratio: 0.46 mg/mg{Cre} — ABNORMAL HIGH (ref 0.00–0.15)
Total Protein, Urine: 13 mg/dL

## 2019-07-19 LAB — PARATHYROID HORMONE, INTACT (NO CA): PTH: 7 pg/mL — ABNORMAL LOW (ref 15–65)

## 2019-07-19 LAB — HIV ANTIBODY (ROUTINE TESTING W REFLEX): HIV Screen 4th Generation wRfx: NONREACTIVE

## 2019-07-19 LAB — PHOSPHORUS: Phosphorus: 3 mg/dL (ref 2.5–4.6)

## 2019-07-19 MED ORDER — CARVEDILOL 6.25 MG PO TABS
6.2500 mg | ORAL_TABLET | Freq: Two times a day (BID) | ORAL | Status: DC
  Administered 2019-07-20 – 2019-07-22 (×5): 6.25 mg via ORAL
  Filled 2019-07-19 (×5): qty 1

## 2019-07-19 MED ORDER — SODIUM CHLORIDE 0.9 % IV SOLN: INTRAVENOUS | Status: DC

## 2019-07-19 MED ORDER — POTASSIUM CHLORIDE CRYS ER 20 MEQ PO TBCR
30.0000 meq | EXTENDED_RELEASE_TABLET | Freq: Three times a day (TID) | ORAL | Status: AC
  Administered 2019-07-19 – 2019-07-20 (×2): 30 meq via ORAL
  Filled 2019-07-19 (×2): qty 1

## 2019-07-19 MED ORDER — FUROSEMIDE 20 MG PO TABS
20.0000 mg | ORAL_TABLET | Freq: Two times a day (BID) | ORAL | Status: DC
  Administered 2019-07-20 – 2019-07-22 (×5): 20 mg via ORAL
  Filled 2019-07-19 (×5): qty 1

## 2019-07-19 MED ORDER — IOHEXOL 9 MG/ML PO SOLN
500.0000 mL | ORAL | Status: AC
  Administered 2019-07-19: 500 mL via ORAL

## 2019-07-19 MED ORDER — ONDANSETRON HCL 4 MG/2ML IJ SOLN
4.0000 mg | Freq: Four times a day (QID) | INTRAMUSCULAR | Status: DC | PRN
  Administered 2019-07-20: 4 mg via INTRAVENOUS
  Filled 2019-07-19: qty 2

## 2019-07-19 MED ORDER — ONDANSETRON HCL 4 MG/2ML IJ SOLN: 4.0000 mg | Freq: Four times a day (QID) | INTRAMUSCULAR | Status: DC

## 2019-07-19 MED ORDER — IOHEXOL 9 MG/ML PO SOLN
ORAL | Status: AC
  Filled 2019-07-19: qty 500

## 2019-07-19 MED ORDER — CALCITONIN (SALMON) 200 UNIT/ML IJ SOLN
4.0000 [IU]/kg | Freq: Two times a day (BID) | INTRAMUSCULAR | Status: AC
  Administered 2019-07-19 – 2019-07-21 (×4): 300 [IU] via SUBCUTANEOUS
  Filled 2019-07-19 (×4): qty 1.5

## 2019-07-19 NOTE — Progress Notes (Signed)
This pt c/o right hand pain, and hand coolness, MD made aware.  Right AC IV is intact with excellent blood return.  Will continue to monitor pt.
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
## 2019-07-19 NOTE — Progress Notes (Signed)
PROGRESS NOTE  Phillip Kim WUJ:[id number] DOB: 02/15/1985 DOA: 07/18/2019 PCP: Christa See, FNP  Brief History   Phillip Kim is a 34 y.o. male with no significant medical history presented with new onset of headaches nausea andvomiting. Symptom started about 2-3 weeks with initially epigastric pain, intermittent nausea. He went to [employer] last night where he was worked up for hypertensive urgency.  Work-up found he had AKI, and hypercalcemia, but patient left AMA and was instructed to come back if symptoms progress. Patient came for symptoms have gotten worse and he would like to be reevaluated. Patient denies the use of drugs, alcohol, smoking.   [facility] were consulted to admit the patient for further evaluation and treatment. He was admitted to a progressive bed. He has been given IV fluids and antihypertensives, as well as pain control and antiemetics. Nephrology has been consulted.  He denies alcohol or illicit drug use.  Consultants  . Nephrology  Procedures  . None  Antibiotics   Anti-infectives (From admission, onward)   None    .  Subjective  The patient is sitting up in bed. He states that he feels well. His wife and father are at bedside.  Objective   Vitals:  Vitals:   07/19/19 0542 07/19/19 0822  BP: (!) 164/112 (!) 160/96  Pulse: 86 96  Resp: 17 16  Temp: 98 F (36.7 C) 97.7 F (36.5 C)  SpO2: 98% 100%   Exam:  Constitutional:  . The patient is awake, alert, and oriented x 3. No acute distress. Respiratory:  . No increased work of breathing. . No wheezes, rales, or rhonchi . No tactile fremitus Cardiovascular:  . Regular rate and rhythm . No murmurs, ectopy, or gallups. . No lateral PMI. No thrills. Abdomen:  . Abdomen is soft, non-tender, non-distended . No hernias, masses, or organomegaly . Normoactive bowel sounds.  Musculoskeletal:  . No cyanosis, clubbing, or edema Skin:  . No rashes, lesions,  ulcers . palpation of skin: no induration or nodules Neurologic:  . CN 2-12 intact . Sensation all 4 extremities intact Psychiatric:  . Mental status o Mood, affect appropriate o Orientation to person, place, time  . judgment and insight appear intact  I have personally reviewed the following:   Today's Data  . Vitals, CMP, CBC, Protein creatinine ratio, Phos  Imaging  . Renal ultrasound  Cardiology Data  . EKG - sinus tachycardia with LVH  Scheduled Meds: . amLODipine  5 mg Oral BID  . carvedilol  3.125 mg Oral BID WC  . hydrALAZINE  25 mg Oral Q6H   Continuous Infusions: . sodium chloride    . sodium chloride 50 mL/hr at 07/19/19 1200    Active Problems:   HTN (hypertension)   AKI (acute kidney injury) ([place])   Hypertensive urgency   LOS: 1 day   A & P  Hypertension emergency: Headache, nausea and vomiting on admission. Now symptoms are resolved, although blood pressures remain elevated. Have increased the dose of hydralazine. IV fluid rate has been reduced. Continue to monitor. ACE I/ARB are not given due to elevated creatinine. I appreciate Dr. Sue Lush assistance. I believe that this hypertension is not new to this patient given the LVH present on EKG. Will also check echocardiogram.  Renal insufficiency: Likely a strong chronic component given the patient's normal CO2 and anemia. Renal ultrasound shows evidence of medical renal disease. No hydronephrosis, atrophy, or stones observed.  His creatinine has responded only minimally to hydration.   Hypercalcemia:  Concern for hypercalcemia of malignancy with low PTH, elevated Alk Phos, and low vitamin D. Urine calcium also pending. Due to renal insufficiency I will hold off on pamidronate for now. I appreciate Dr. Sue Lush assistance.  Elevated Alk Phos and GGT: Will ultrasound right upper quadrant. Pt denies ETOH or illicit drug use.  Elevated serum protein: SPEP has been ordered.  I have seen and examined this  patient myself. I have spent 46 minutes in his evaluation and care.  DVT prophylaxis: SCD Code Status:Full Family Communication: Wife and father at bedside Disposition Plan: The patient is from home. Anticipate discharge to home. Barriers to discharge completion of work up and resolution of elevated blood pressures and hypercalcemia.  Ava Swayze, DO [facility] Direct contact: see [email or web address]  7PM-7AM contact night coverage as above 07/19/2019, 5:21 PM  LOS: 1 day

## 2019-07-19 NOTE — Progress Notes (Signed)
This pt's ca level is 13.2, MD made aware.  Will continue to monitor pt.

## 2019-07-20 ENCOUNTER — Inpatient Hospital Stay (HOSPITAL_COMMUNITY): Payer: Self-pay

## 2019-07-20 DIAGNOSIS — R918 Other nonspecific abnormal finding of lung field: Secondary | ICD-10-CM

## 2019-07-20 DIAGNOSIS — I161 Hypertensive emergency: Secondary | ICD-10-CM

## 2019-07-20 DIAGNOSIS — R05 Cough: Secondary | ICD-10-CM

## 2019-07-20 LAB — CBC WITH DIFFERENTIAL/PLATELET
Abs Immature Granulocytes: 0.03 10*3/uL (ref 0.00–0.07)
Basophils Absolute: 0 10*3/uL (ref 0.0–0.1)
Basophils Relative: 1 %
Eosinophils Absolute: 0.1 10*3/uL (ref 0.0–0.5)
Eosinophils Relative: 1 %
HCT: 34.7 % — ABNORMAL LOW (ref 39.0–52.0)
Hemoglobin: 11.7 g/dL — ABNORMAL LOW (ref 13.0–17.0)
Immature Granulocytes: 1 %
Lymphocytes Relative: 18 %
Lymphs Abs: 1.1 10*3/uL (ref 0.7–4.0)
MCH: 27.2 pg (ref 26.0–34.0)
MCHC: 33.7 g/dL (ref 30.0–36.0)
MCV: 80.7 fL (ref 80.0–100.0)
Monocytes Absolute: 0.5 10*3/uL (ref 0.1–1.0)
Monocytes Relative: 9 %
Neutro Abs: 4.3 10*3/uL (ref 1.7–7.7)
Neutrophils Relative %: 70 %
Platelets: 218 10*3/uL (ref 150–400)
RBC: 4.3 MIL/uL (ref 4.22–5.81)
RDW: 15.3 % (ref 11.5–15.5)
WBC: 6 10*3/uL (ref 4.0–10.5)
nRBC: 0 % (ref 0.0–0.2)

## 2019-07-20 LAB — PSA: Prostatic Specific Antigen: 0.34 ng/mL (ref 0.00–4.00)

## 2019-07-20 LAB — COMPREHENSIVE METABOLIC PANEL
ALT: 20 U/L (ref 0–44)
AST: 18 U/L (ref 15–41)
Albumin: 3.6 g/dL (ref 3.5–5.0)
Alkaline Phosphatase: 146 U/L — ABNORMAL HIGH (ref 38–126)
Anion gap: 8 (ref 5–15)
BUN: 17 mg/dL (ref 6–20)
CO2: 19 mmol/L — ABNORMAL LOW (ref 22–32)
Calcium: 11.6 mg/dL — ABNORMAL HIGH (ref 8.9–10.3)
Chloride: 111 mmol/L (ref 98–111)
Creatinine, Ser: 2.23 mg/dL — ABNORMAL HIGH (ref 0.61–1.24)
GFR calc Af Amer: 43 mL/min — ABNORMAL LOW (ref >60)
GFR calc non Af Amer: 37 mL/min — ABNORMAL LOW (ref >60)
Glucose, Bld: 102 mg/dL — ABNORMAL HIGH (ref 70–99)
Potassium: 3.2 mmol/L — ABNORMAL LOW (ref 3.5–5.1)
Sodium: 138 mmol/L (ref 135–145)
Total Bilirubin: 0.6 mg/dL (ref 0.3–1.2)
Total Protein: 8.5 g/dL — ABNORMAL HIGH (ref 6.5–8.1)

## 2019-07-20 LAB — HCG, SERUM, QUALITATIVE: Preg, Serum: NEGATIVE

## 2019-07-20 LAB — ECHOCARDIOGRAM COMPLETE
Height: 74 in
Weight: 2643.2 oz

## 2019-07-20 LAB — LACTATE DEHYDROGENASE: LDH: 115 U/L (ref 98–192)

## 2019-07-20 MED ORDER — TUBERCULIN PPD 5 UNIT/0.1ML ID SOLN
5.0000 [IU] | Freq: Once | INTRADERMAL | Status: AC
  Administered 2019-07-20: 5 [IU] via INTRADERMAL
  Filled 2019-07-20: qty 0.1

## 2019-07-20 NOTE — Consult Note (Addendum)
NAME:  Phillip Kim, MRN:  [id number], DOB:  [birth year], LOS: 2 ADMISSION DATE:  07/18/2019, CONSULTATION DATE:  07/20/2019 REFERRING MD:  Dr Benny Lennert, CHIEF COMPLAINT: Headaches  Brief History   Patient was admitted to the hospital with a couple of weeks history of headaches, nausea and vomiting. Incidental CT scan chest findings showing thickening of the peribronchovascular interstitium, micronodularity in the lungs  History of present illness   Did have history of epigastric pain, nausea Noted to have hypertensive urgency at [facility] Kidney dysfunction, hypercalcemia noted. 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 No suspicious osseous lesions to suggest either primary bone malignancy or metastatic disease to the bones. 2. Highly unusual appearance of the lungs, as detailed above, concerning for potential interstitial lung disease. Outpatient referral to Pulmonology for further evaluation is recommended, with consideration for further evaluation with high-resolution chest CT if clinically appropriate. 3. Splenomegaly. 4. Nonobstructive calculi in the collecting systems of both kidneys measuring up to 4 mm in the  lower pole collecting system of left kidney. No ureteral stones or findings to suggest urinary tract obstruction. Micro Data:  SARS coronavirus negative  Antimicrobials:  None  Interim history/subjective:  Feeling better generally Headache is better  Objective   Blood pressure (!) 143/92, pulse 90, temperature 98.7 F (37.1 C), resp. rate 19, height 6\' 2"  (1.88 m), weight 74.9 kg, SpO2 97 %.        Intake/Output Summary (Last 24 hours) at 07/20/2019 1543 Last data filed at 07/20/2019 0600 Gross per 24 hour  Intake 300 ml  Output --  Net 300 ml   Filed Weights   07/19/19 0007  Weight: 74.9 kg    Examination: General: Young gentleman does not appear to be in distress HENT: Moist oral mucosa, neck is supple with no JVD, no thyromegaly Lungs: Clear breath sounds bilaterally Cardiovascular: S1-S2 appreciated Abdomen: Soft, bowel sounds normoactive Extremities: No clubbing, no edema Neuro: Alert and oriented x3 GU:   Resolved Hospital Problem list     Assessment & Plan:   Abormal CT scan of the chest showing groundglass changes, peribronchovascular prominence, no adenopathy -Concern for interstitial lung disease -Concern for sarcoidosis in the context of hypercalcemia and renal dysfunction  Never smoker, no illicit drug use Exposure to drywall  Other interstitial lung disease is also possibility although it is less likely the patient will not have any symptoms  No history suggestive of hypersensitivity and CT scan is not suggestive of this  Exposure to silica dust from drywall is a possibility however it is difficult to quantify how much exposure he had   High resolution CT scan of the chest may be obtained  Bronchoscopy also discussed as an option -Bronchoscopy will show [place] if there is  any granulomas that may be consistent with sarcoidosis  Blood work for connective tissue disease, vasculitic panel already sent by nephrology  We will continue to  follow  Virl Diamond, MD [place] [facility] Pager: [phone]

## 2019-07-20 NOTE — Progress Notes (Addendum)
PROGRESS NOTE  Tomie Elko GUY:[id number] DOB: [birth year] DOA: 07/18/2019 PCP: Christa See, FNP  Brief History   Phillip Kim is a 34 y.o. male with no significant medical history presented with new onset of headaches nausea and vomiting.  Symptom started about 2-3 weeks with initially epigastric pain, intermittent nausea. He went to [employer] last night where he was worked up for hypertensive urgency.  Work-up found he had AKI, and hypercalcemia, but patient left AMA and was instructed to come back if symptoms progress.  Patient came for symptoms have gotten worse and he would like to be reevaluated.  Patient denies the use of drugs, alcohol, smoking.   [facility] were consulted to admit the patient for further evaluation and treatment. He was admitted to a progressive bed. He has been given IV fluids and antihypertensives, as well as pain control and antiemetics. Nephrology has been consulted. The patient has responded to IV lasix with calcitonin with reduction in calcium and blood pressure. Creatinine is only minimally reduced.   Echocardiogram was obtained and demonstrated a normal EF with mild LVH and mild dilatation of the left ventricle. There is grade 1 diastolic dysfunction. No wall motion abnormalities. RV systolic function and size are normal.  CT chest demonstrated nodularity and thickening of the peribronchovascular interstitium. PCCM was consulted. Bronchoscopy may be warranted to differentiate between sarcoidosis and ILD.   The patient's family reports a tendency toward autoimmune disease in the patient's father's side of the family. They also report that in 2014 the patient had some swollen lymph nodes "behind his ears". The family has shared with me their access to the patient's records where his PCP at the time had actually recorded that he had supraclavicular nodes. I have confirmed with the patient that the nodes were actually post-auricular nodes.   He denies  alcohol or illicit drug use.  Consultants  Nephrology PCCM  Procedures  None  Antibiotics   Anti-infectives (From admission, onward)    None      Subjective  The patient is sitting up in bed. He states that he feels well. His wife and mother are at bedside.  Objective   Vitals:  Vitals:   07/20/19 0741 07/20/19 1529  BP: 133/76 (!) 143/92  Pulse: 84 90  Resp:    Temp: 97.9 F (36.6 C) 98.7 F (37.1 C)  SpO2: 94% 97%   Exam:  Constitutional:  The patient is awake, alert, and oriented x 3. No acute distress. Respiratory:  No increased work of breathing. No wheezes, rales, or rhonchi No tactile fremitus Cardiovascular:  Regular rate and rhythm No murmurs, ectopy, or gallups. No lateral PMI. No thrills. Abdomen:  Abdomen is soft, non-tender, non-distended No hernias, masses, or organomegaly Normoactive bowel sounds.  Musculoskeletal:  No cyanosis, clubbing, or edema Lymphatics: Preauricular, post auricular, submandibular, supraclavicular or axillary lymphadenopathy Skin:  No rashes, lesions, ulcers palpation of skin: no induration or nodules Neurologic:  CN 2-12 intact Sensation all 4 extremities intact Psychiatric:  Mental status Mood, affect appropriate Orientation to person, place, time  judgment and insight appear intact  I have personally reviewed the following:   Today's Data  Vitals, CMP, CBC, Protein creatinine ratio, Phos  Imaging  Renal ultrasound  Cardiology Data  EKG - sinus tachycardia with LVH Echocardiogram - demonstrated a normal EF with mild LVH and mild dilatation of the left ventricle. There is grade 1 diastolic dysfunction. No wall motion abnormalities. RV systolic function and size are normal.  Scheduled Meds:  amLODipine  5 mg Oral BID   calcitonin  4 Units/kg Subcutaneous BID   carvedilol  6.25 mg Oral BID WC   furosemide  20 mg Oral BID   hydrALAZINE  25 mg Oral Q6H   tuberculin  5 Units Intradermal Once    Continuous Infusions:  sodium chloride     sodium chloride 100 mL/hr at 07/20/19 1100    Active Problems:   HTN (hypertension)   AKI (acute kidney injury) ([place])   Hypertensive urgency   LOS: 2 days   A & P  Hypertension emergency: Resolving on lasix, norvasc, carvedilol, and hydralazine. Headache, nausea and vomiting on admission. Now symptoms are resolved, although blood pressures remain elevated. Have increased the dose of hydralazine. IV fluid rate has been reduced. Continue to monitor. ACE I/ARB are not given due to elevated creatinine. I appreciate Dr. Sue Lush assistance. I believe that this hypertension is not new to this patient given the LVH present on EKG.   LVH: Due to long-standing uncontrolled hypertension. Echocardiogram has demonstrated a normal EF with mild LVH and mild dilatation of the left ventricle. There is grade 1 diastolic dysfunction. No wall motion abnormalities. RV systolic function and size are normal.   Renal insufficiency: Only minimally improved. Likely a strong chronic component given the patient's normal CO2 and anemia. Renal ultrasound shows evidence of medical renal disease. No hydronephrosis, atrophy, or obstructing stones observed.     Hypercalcemia: Resolving on lasix and calcitoninConcern for hypercalcemia of malignancy, or autoimmune disease such as sarcoidosis with low PTH, elevated Alk Phos, and low vitamin D. I appreciate Dr. Sue Lush assistance.  Hypokalemia: Supplement and monitor.  Pulmonary nodularity with thickening of the peribronchovascular interstitium. PCCM was consulted. Bronchoscopy may be warranted to differentiate between sarcoidosis and ILD. I appreciate pulmonology's assistance.  Elevated Alk Phos and GGT: Will ultrasound right upper quadrant. Pt denies ETOH or illicit drug use.  Elevated serum protein: SPEP has been ordered.  I have seen and examined this patient myself. I have spent 50 minutes in his evaluation and care.    DVT prophylaxis: SCD Code Status:Full Family Communication: Wife and mother at bedside. All questions answered to the best of my abilities. Disposition Plan: The patient is from home. Anticipate discharge to home. Barriers to discharge completion of work up and resolution of elevated blood pressures and hypercalcemia.  Aubrina Nieman, DO [facility] Direct contact: see [email or web address]  7PM-7AM contact night coverage as above 07/20/2019, 5:17 PM  LOS: 1 day

## 2019-07-20 NOTE — TOC Progression Note (Signed)
Transition of Care [facility]) - Progression Note    Patient Details  Name: Phillip Kim MRN: [id number] Date of Birth: 06/23/1985  [facility] CM/SW Contact  Beckie Busing, RN Phone Number: [phone]  07/20/2019, 12:23 PM  Clinical Narrative:    CM went to room to assess patient and determine needs. Patient is currently off unit for testing. Financial counselor referral made to assess for patient with no insurance listed. Cm will continue to follow.         Expected Discharge Plan and Services                                                 Social Determinants of Health (SDOH) Interventions    Readmission Risk Interventions No flowsheet data found.

## 2019-07-20 NOTE — Progress Notes (Addendum)
[facility] Progress Note    Assessment/ Plan:   1. Abrnormal CT chest - in young adult male w/o prior medical problems, in association w/ mild anemia.  PTH is low, vit D low. CT C/A/P with no overt signs of malignancy but with + splenomegaly and bilateral reticulonodular opacities.  No hydro but with tiny nonobstructing calculi.  PSA, AFP, and bHCG pending.  Checking ANA, ANCA,complements, ENA, SSA/B, ACEtest.  Pulmonary consulted.  ? ILD vs sarcoid vs other  2. Hypercalcemia - low pth and low vit D, IV NS w/ po lasix 20 bid, also calcitonin for 24- 48 hrs.  Getting better.   3. AKI: UA negative, UP/C low, getting a little better with IVFs treating Ca.  No indication for biopsy at present 4. HTN - bp's better , cont po meds w/ prn IV  Subjective:    Seen in room.  Tired.  Ca is getting better with IVFs/ lasix/ calcitonin.  CT chest with GGO--> ? Interstitial lung disease.  Discussed in room with pt, mom, wife.     Objective:   BP 133/76 (BP Location: Left Arm)   Pulse 84   Temp 97.9 F (36.6 C)   Resp 19   Ht 6\' 2"  (1.88 m)   Wt 74.9 kg   SpO2 94%   BMI 21.21 kg/m   Intake/Output Summary (Last 24 hours) at 07/20/2019 1507 Last data filed at 07/20/2019 0600 Gross per 24 hour  Intake 300 ml  Output --  Net 300 ml   Weight change:   Physical Exam: Gen: NAD, lying in bed CVS: RRR, loud S2 Resp: bilateral basilar dry crackles Abd: nontender Ext: no LE edema  Imaging: CT ABDOMEN PELVIS WO CONTRAST  Result Date: 07/20/2019 CLINICAL DATA:  78Thirty-five year old male with history of nausea and vomiting presenting with renal failure and hypercalcemia. Evaluate for potential bony malignancy or metastatic disease. EXAM: CT CHEST, ABDOMEN AND PELVIS WITHOUT CONTRAST TECHNIQUE: Multidetector CT imaging of the chest, abdomen and pelvis was performed following the standard protocol without IV contrast. COMPARISON:  CT of the chest, abdomen and pelvis 08/19/2009. FINDINGS:  CT CHEST FINDINGS Cardiovascular: Heart size is normal. There is no significant pericardial fluid, thickening or pericardial calcification. No atherosclerotic calcifications in the thoracic aorta or the coronary arteries. Mediastinum/Nodes: No pathologically enlarged mediastinal or hilar lymph nodes. Please note that accurate exclusion of hilar adenopathy is limited on noncontrast CT scans. Esophagus is unremarkable in appearance. No axillary lymphadenopathy. Lungs/Pleura: Widespread areas of septal thickening, patchy areas of peripheral bronchiolectasis and extensive thickening of the peribronchovascular interstitium with widespread peribronchovascular ground-glass attenuation and micro nodularity scattered throughout all aspects of the lungs bilaterally, with no definitive craniocaudal gradient. No confluent consolidative airspace disease. No pleural effusions. No larger more suspicious appearing pulmonary nodules or masses are noted. Musculoskeletal: There are no aggressive appearing lytic or blastic lesions noted in the visualized portions of the skeleton. CT ABDOMEN PELVIS FINDINGS Hepatobiliary: No definite suspicious cystic or solid hepatic lesions are confidently identified on today's noncontrast CT examination. Unenhanced appearance of the gallbladder is normal. Pancreas: No definite pancreatic mass or peripancreatic fluid collections or inflammatory changes are noted on today's noncontrast CT examination. Spleen: Spleen is enlarged measuring 15.4 x 8.5 x 22.5 cm (estimated splenic volume of 1,473 mL) . Adrenals/Urinary Tract: Tiny nonobstructive calculi are present in the collecting systems of both kidneys measuring up to 4 mm in the lower pole collecting system of the left kidney. No additional calculi are  noted along the course of either ureter or within the lumen of the urinary bladder. Unenhanced appearance of the kidneys is otherwise normal. No hydroureteronephrosis. Urinary bladder is unremarkable in  appearance. Bilateral adrenal glands are normal in appearance. Stomach/Bowel: Unenhanced appearance of the stomach is normal. No pathologic dilatation of small bowel or colon. The appendix is not confidently identified and may be surgically absent. Regardless, there are no inflammatory changes noted adjacent to the cecum to suggest the presence of an acute appendicitis at this time. Vascular/Lymphatic: No atherosclerotic calcifications in the abdominal aorta or pelvic vasculature. No lymphadenopathy noted in the abdomen or pelvis. Reproductive: Prostate gland and seminal vesicles are unremarkable in appearance. Other: No significant volume of ascites.  No pneumoperitoneum. Musculoskeletal: There are no aggressive appearing lytic or blastic lesions noted in the visualized portions of the skeleton. IMPRESSION: 1. No suspicious osseous lesions to suggest either primary bone malignancy or metastatic disease to the bones. 2. Highly unusual appearance of the lungs, as detailed above, concerning for potential interstitial lung disease. Outpatient referral to Pulmonology for further evaluation is recommended, with consideration for further evaluation with high-resolution chest CT if clinically appropriate. 3. Splenomegaly. 4. Nonobstructive calculi in the collecting systems of both kidneys measuring up to 4 mm in the lower pole collecting system of left kidney. No ureteral stones or findings to suggest urinary tract obstruction. Electronically Signed   By: Trudie Reed M.D.   On: 07/20/2019 08:17   CT CHEST WO CONTRAST  Result Date: 07/20/2019 CLINICAL DATA:  34 year old male with history of nausea and vomiting presenting with renal failure and hypercalcemia. Evaluate for potential bony malignancy or metastatic disease. EXAM: CT CHEST, ABDOMEN AND PELVIS WITHOUT CONTRAST TECHNIQUE: Multidetector CT imaging of the chest, abdomen and pelvis was performed following the standard protocol without IV contrast.  COMPARISON:  CT of the chest, abdomen and pelvis 08/19/2009. FINDINGS: CT CHEST FINDINGS Cardiovascular: Heart size is normal. There is no significant pericardial fluid, thickening or pericardial calcification. No atherosclerotic calcifications in the thoracic aorta or the coronary arteries. Mediastinum/Nodes: No pathologically enlarged mediastinal or hilar lymph nodes. Please note that accurate exclusion of hilar adenopathy is limited on noncontrast CT scans. Esophagus is unremarkable in appearance. No axillary lymphadenopathy. Lungs/Pleura: Widespread areas of septal thickening, patchy areas of peripheral bronchiolectasis and extensive thickening of the peribronchovascular interstitium with widespread peribronchovascular ground-glass attenuation and micro nodularity scattered throughout all aspects of the lungs bilaterally, with no definitive craniocaudal gradient. No confluent consolidative airspace disease. No pleural effusions. No larger more suspicious appearing pulmonary nodules or masses are noted. Musculoskeletal: There are no aggressive appearing lytic or blastic lesions noted in the visualized portions of the skeleton. CT ABDOMEN PELVIS FINDINGS Hepatobiliary: No definite suspicious cystic or solid hepatic lesions are confidently identified on today's noncontrast CT examination. Unenhanced appearance of the gallbladder is normal. Pancreas: No definite pancreatic mass or peripancreatic fluid collections or inflammatory changes are noted on today's noncontrast CT examination. Spleen: Spleen is enlarged measuring 15.4 x 8.5 x 22.5 cm (estimated splenic volume of 1,473 mL) . Adrenals/Urinary Tract: Tiny nonobstructive calculi are present in the collecting systems of both kidneys measuring up to 4 mm in the lower pole collecting system of the left kidney. No additional calculi are noted along the course of either ureter or within the lumen of the urinary bladder. Unenhanced appearance of the kidneys is  otherwise normal. No hydroureteronephrosis. Urinary bladder is unremarkable in appearance. Bilateral adrenal glands are normal in appearance. Stomach/Bowel: Unenhanced  appearance of the stomach is normal. No pathologic dilatation of small bowel or colon. The appendix is not confidently identified and may be surgically absent. Regardless, there are no inflammatory changes noted adjacent to the cecum to suggest the presence of an acute appendicitis at this time. Vascular/Lymphatic: No atherosclerotic calcifications in the abdominal aorta or pelvic vasculature. No lymphadenopathy noted in the abdomen or pelvis. Reproductive: Prostate gland and seminal vesicles are unremarkable in appearance. Other: No significant volume of ascites.  No pneumoperitoneum. Musculoskeletal: There are no aggressive appearing lytic or blastic lesions noted in the visualized portions of the skeleton. IMPRESSION: 1. No suspicious osseous lesions to suggest either primary bone malignancy or metastatic disease to the bones. 2. Highly unusual appearance of the lungs, as detailed above, concerning for potential interstitial lung disease. Outpatient referral to Pulmonology for further evaluation is recommended, with consideration for further evaluation with high-resolution chest CT if clinically appropriate. 3. Splenomegaly. 4. Nonobstructive calculi in the collecting systems of both kidneys measuring up to 4 mm in the lower pole collecting system of left kidney. No ureteral stones or findings to suggest urinary tract obstruction. Electronically Signed   By: Vinnie Langton M.D.   On: 07/20/2019 08:17   US RENAL  Result Date: 07/19/2019 CLINICAL DATA:  Initial evaluation for acute kidney injury. EXAM: RENAL / URINARY TRACT ULTRASOUND COMPLETE COMPARISON:  Prior CT from 08/19/2009. FINDINGS: Right Kidney: Renal measurements: 11.6 x 4.7 x 6.0 cm = volume: 170.9 mL. Increased echogenicity seen within the renal parenchyma. No nephrolithiasis or  hydronephrosis. No focal renal mass. Left Kidney: Renal measurements: 13.3 x 5.7 x 4.9 cm = volume: 192.6 mL. Diffusely increased echogenicity seen within the renal parenchyma. No nephrolithiasis or hydronephrosis. No focal renal mass. Bladder: Appears normal for degree of bladder distention. Other: Mild splenomegaly noted. IMPRESSION: 1. Increased echogenicity within the renal parenchyma, consistent with medical renal disease. 2. No hydronephrosis. 3. Splenomegaly. Electronically Signed   By: Jeannine Boga M.D.   On: 07/19/2019 07:53   ECHOCARDIOGRAM COMPLETE  Result Date: 07/20/2019    ECHOCARDIOGRAM REPORT   Patient Name:   Phillip Kim Date of Exam: 07/20/2019 Medical Rec #:  [id number]         Height:       74.0 in Accession #:    [id number]        Weight:       165.2 lb Date of Birth:  11/01/1985         BSA:          2.004 m Patient Age:    34 years          BP:           133/76 mmHg Patient Gender: M                 HR:           73 bpm. Exam Location:  Inpatient Procedure: 2D Echo, Cardiac Doppler and Color Doppler Indications:    R00.8 Other abnormalities of heart beat  History:        Patient has no prior history of Echocardiogram examinations.                 Risk Factors:Hypertension. Renal failure.  Sonographer:    Roseanna Rainbow RDCS Referring Phys: [id number] AVA SWAYZE IMPRESSIONS  1. Normal LV systolic function; mild LVH and LVE; grade 1 diastolic dysfunction.  2. Left ventricular ejection fraction, by estimation, is 55 to 60%. The left ventricle  has normal function. The left ventricle has no regional wall motion abnormalities. The left ventricular internal cavity size was mildly dilated. There is mild left ventricular hypertrophy. Left ventricular diastolic parameters are consistent with Grade I diastolic dysfunction (impaired relaxation).  3. Right ventricular systolic function is normal. The right ventricular size is normal.  4. The mitral valve is normal in structure. Trivial mitral valve  regurgitation. No evidence of mitral stenosis.  5. The aortic valve is tricuspid. Aortic valve regurgitation is not visualized. No aortic stenosis is present.  6. The inferior vena cava is normal in size with greater than 50% respiratory variability, suggesting right atrial pressure of 3 mmHg. FINDINGS  Left Ventricle: Left ventricular ejection fraction, by estimation, is 55 to 60%. The left ventricle has normal function. The left ventricle has no regional wall motion abnormalities. The left ventricular internal cavity size was mildly dilated. There is  mild left ventricular hypertrophy. Left ventricular diastolic parameters are consistent with Grade I diastolic dysfunction (impaired relaxation). Right Ventricle: The right ventricular size is normal.Right ventricular systolic function is normal. Left Atrium: Left atrial size was normal in size. Right Atrium: Right atrial size was normal in size. Pericardium: There is no evidence of pericardial effusion. Mitral Valve: The mitral valve is normal in structure. Normal mobility of the mitral valve leaflets. Trivial mitral valve regurgitation. No evidence of mitral valve stenosis. Tricuspid Valve: The tricuspid valve is normal in structure. Tricuspid valve regurgitation is trivial. No evidence of tricuspid stenosis. Aortic Valve: The aortic valve is tricuspid. Aortic valve regurgitation is not visualized. No aortic stenosis is present. Pulmonic Valve: The pulmonic valve was normal in structure. Pulmonic valve regurgitation is not visualized. No evidence of pulmonic stenosis. Aorta: The aortic root is normal in size and structure. Venous: The inferior vena cava is normal in size with greater than 50% respiratory variability, suggesting right atrial pressure of 3 mmHg. IAS/Shunts: No atrial level shunt detected by color flow Doppler. Additional Comments: Normal LV systolic function; mild LVH and LVE; grade 1 diastolic dysfunction.  LEFT VENTRICLE PLAX 2D LVIDd:         5.35  cm      Diastology LVIDs:         3.89 cm      LV e' lateral:   10.70 cm/s LV PW:         1.09 cm      LV E/e' lateral: 6.0 LV IVS:        0.88 cm      LV e' medial:    6.85 cm/s LVOT diam:     2.10 cm      LV E/e' medial:  9.3 LV SV:         71 LV SV Index:   35 LVOT Area:     3.46 cm  LV Volumes (MOD) LV vol d, MOD A2C: 93.9 ml LV vol d, MOD A4C: 119.0 ml LV vol s, MOD A2C: 35.0 ml LV vol s, MOD A4C: 51.3 ml LV SV MOD A2C:     58.9 ml LV SV MOD A4C:     119.0 ml LV SV MOD BP:      62.0 ml RIGHT VENTRICLE             IVC RV S prime:     12.80 cm/s  IVC diam: 1.59 cm TAPSE (M-mode): 1.7 cm LEFT ATRIUM             Index  RIGHT ATRIUM           Index LA diam:        3.50 cm 1.75 cm/m  RA Area:     14.20 cm LA Vol (A2C):   55.6 ml 27.75 ml/m RA Volume:   35.30 ml  17.62 ml/m LA Vol (A4C):   31.1 ml 15.52 ml/m LA Biplane Vol: 44.4 ml 22.16 ml/m  AORTIC VALVE LVOT Vmax:   126.00 cm/s LVOT Vmean:  85.600 cm/s LVOT VTI:    0.205 m  AORTA Ao Root diam: 3.20 cm MITRAL VALVE MV Area (PHT): 2.91 cm    SHUNTS MV Decel Time: 261 msec    Systemic VTI:  0.20 m MV E velocity: 63.70 cm/s  Systemic Diam: 2.10 cm MV A velocity: 53.30 cm/s MV E/A ratio:  1.20 Olga Millers MD Electronically signed by Olga Millers MD Signature Date/Time: 07/20/2019/1:40:44 PM    Final    US Abdomen Limited RUQ  Result Date: 07/20/2019 CLINICAL DATA:  Elevated liver function tests. EXAM: ULTRASOUND ABDOMEN LIMITED RIGHT UPPER QUADRANT COMPARISON:  None. FINDINGS: Gallbladder: No gallstones or wall thickening visualized. No sonographic Murphy sign noted by sonographer. Common bile duct: Diameter: 5.3 mm, normal. Liver: No focal lesion identified. Within normal limits in parenchymal echogenicity. Portal vein is patent on color Doppler imaging with normal direction of blood flow towards the liver. Other: Echogenic right renal parenchyma. IMPRESSION: No acute abnormality. Echogenic right renal parenchyma consistent with renal medical  disease as demonstrated on the prior renal ultrasound dated 07/19/2019. Electronically Signed   By: Francene Boyers M.D.   On: 07/20/2019 10:47    Labs: BMET Recent Labs  Lab 07/17/19 1851 07/18/19 1650 07/19/19 1100 07/20/19 0352  NA 138 139 138 138  K 3.6 3.0* 3.4* 3.2*  CL 103 106 106 111  CO2 24 27 24  19*  GLUCOSE 90 95 100* 102*  BUN 18 17 18 17   CREATININE 2.37* 2.45* 2.42* 2.23*  CALCIUM 12.5* 13.5* 13.2* 11.6*  PHOS  --   --  3.0  --    CBC Recent Labs  Lab 07/17/19 1851 07/18/19 1650 07/20/19 0352  WBC 5.3 5.0 6.0  NEUTROABS 3.3 3.6 4.3  HGB 12.1* 12.0* 11.7*  HCT 35.5* 35.3* 34.7*  MCV 79.6* 79.9* 80.7  PLT 233 232 218    Medications:    . amLODipine  5 mg Oral BID  . calcitonin  4 Units/kg Subcutaneous BID  . carvedilol  6.25 mg Oral BID WC  . furosemide  20 mg Oral BID  . hydrALAZINE  25 mg Oral Q6H  . tuberculin  5 Units Intradermal Once      07/20/19, MD 07/20/2019, 3:07 PM

## 2019-07-20 NOTE — Progress Notes (Signed)
  Echocardiogram 2D Echocardiogram has been performed.  Phillip Kim 07/20/2019, 1:06 PM

## 2019-07-20 NOTE — Progress Notes (Signed)
TB skin test given in left forearm. On 07/20/19 @2pm .

## 2019-07-21 DIAGNOSIS — N179 Acute kidney failure, unspecified: Principal | ICD-10-CM

## 2019-07-21 DIAGNOSIS — J849 Interstitial pulmonary disease, unspecified: Secondary | ICD-10-CM

## 2019-07-21 DIAGNOSIS — I10 Essential (primary) hypertension: Secondary | ICD-10-CM

## 2019-07-21 LAB — SJOGRENS SYNDROME-B EXTRACTABLE NUCLEAR ANTIBODY: SSB (La) (ENA) Antibody, IgG: <0.2 AI (ref 0.0–0.9)

## 2019-07-21 LAB — MPO/PR-3 (ANCA) ANTIBODIES
ANCA Proteinase 3: 9 U/mL — ABNORMAL HIGH (ref 0.0–3.5)
Myeloperoxidase Abs: <9 U/mL (ref 0.0–9.0)

## 2019-07-21 LAB — CBC
HCT: 34.8 % — ABNORMAL LOW (ref 39.0–52.0)
Hemoglobin: 11.6 g/dL — ABNORMAL LOW (ref 13.0–17.0)
MCH: 27.5 pg (ref 26.0–34.0)
MCHC: 33.3 g/dL (ref 30.0–36.0)
MCV: 82.5 fL (ref 80.0–100.0)
Platelets: 217 10*3/uL (ref 150–400)
RBC: 4.22 MIL/uL (ref 4.22–5.81)
RDW: 15.8 % — ABNORMAL HIGH (ref 11.5–15.5)
WBC: 4.9 10*3/uL (ref 4.0–10.5)
nRBC: 0 % (ref 0.0–0.2)

## 2019-07-21 LAB — CD4/CD8 (T-HELPER/T-SUPPRESSOR CELL)
CD4 absolute: 241 /uL — ABNORMAL LOW (ref 400–1790)
CD4%: 27 % — ABNORMAL LOW (ref 33–65)
CD8 T Cell Abs: 353 /uL (ref 190–1000)
CD8tox: 40 % (ref 12–40)
Ratio: 0.68 — ABNORMAL LOW (ref 1.0–3.0)
Total lymphocyte count: 890 /uL — ABNORMAL LOW (ref 1000–4000)

## 2019-07-21 LAB — COMPREHENSIVE METABOLIC PANEL
ALT: 19 U/L (ref 0–44)
AST: 21 U/L (ref 15–41)
Albumin: 3.5 g/dL (ref 3.5–5.0)
Alkaline Phosphatase: 125 U/L (ref 38–126)
Anion gap: 8 (ref 5–15)
BUN: 13 mg/dL (ref 6–20)
CO2: 17 mmol/L — ABNORMAL LOW (ref 22–32)
Calcium: 10.5 mg/dL — ABNORMAL HIGH (ref 8.9–10.3)
Chloride: 112 mmol/L — ABNORMAL HIGH (ref 98–111)
Creatinine, Ser: 1.79 mg/dL — ABNORMAL HIGH (ref 0.61–1.24)
GFR calc Af Amer: 56 mL/min — ABNORMAL LOW (ref >60)
GFR calc non Af Amer: 48 mL/min — ABNORMAL LOW (ref >60)
Glucose, Bld: 82 mg/dL (ref 70–99)
Potassium: 3.1 mmol/L — ABNORMAL LOW (ref 3.5–5.1)
Sodium: 137 mmol/L (ref 135–145)
Total Bilirubin: 0.8 mg/dL (ref 0.3–1.2)
Total Protein: 8.2 g/dL — ABNORMAL HIGH (ref 6.5–8.1)

## 2019-07-21 LAB — KAPPA/LAMBDA LIGHT CHAINS
Kappa free light chain: 124 mg/L — ABNORMAL HIGH (ref 3.3–19.4)
Kappa, lambda light chain ratio: 1.64 (ref 0.26–1.65)
Lambda free light chains: 75.4 mg/L — ABNORMAL HIGH (ref 5.7–26.3)

## 2019-07-21 LAB — PROTEIN ELECTROPHORESIS, SERUM
A/G Ratio: 0.7 (ref 0.7–1.7)
Albumin ELP: 3.7 g/dL (ref 2.9–4.4)
Alpha-1-Globulin: 0.2 g/dL (ref 0.0–0.4)
Alpha-2-Globulin: 0.7 g/dL (ref 0.4–1.0)
Beta Globulin: 1.2 g/dL (ref 0.7–1.3)
Gamma Globulin: 2.8 g/dL — ABNORMAL HIGH (ref 0.4–1.8)
Globulin, Total: 5 g/dL — ABNORMAL HIGH (ref 2.2–3.9)
Total Protein ELP: 8.7 g/dL — ABNORMAL HIGH (ref 6.0–8.5)

## 2019-07-21 LAB — BASIC METABOLIC PANEL
Anion gap: 9 (ref 5–15)
BUN: 13 mg/dL (ref 6–20)
CO2: 17 mmol/L — ABNORMAL LOW (ref 22–32)
Calcium: 10.9 mg/dL — ABNORMAL HIGH (ref 8.9–10.3)
Chloride: 111 mmol/L (ref 98–111)
Creatinine, Ser: 1.7 mg/dL — ABNORMAL HIGH (ref 0.61–1.24)
GFR calc Af Amer: 59 mL/min — ABNORMAL LOW (ref >60)
GFR calc non Af Amer: 51 mL/min — ABNORMAL LOW (ref >60)
Glucose, Bld: 96 mg/dL (ref 70–99)
Potassium: 3.5 mmol/L (ref 3.5–5.1)
Sodium: 137 mmol/L (ref 135–145)

## 2019-07-21 LAB — C3 COMPLEMENT: C3 Complement: 170 mg/dL — ABNORMAL HIGH (ref 82–167)

## 2019-07-21 LAB — AFP TUMOR MARKER: AFP, Serum, Tumor Marker: 2.2 ng/mL (ref 0.0–8.3)

## 2019-07-21 LAB — C4 COMPLEMENT: Complement C4, Body Fluid: 19 mg/dL (ref 12–38)

## 2019-07-21 LAB — ANGIOTENSIN CONVERTING ENZYME: Angiotensin-Converting Enzyme: 161 U/L — ABNORMAL HIGH (ref 14–82)

## 2019-07-21 LAB — CALCITRIOL (1,25 DI-OH VIT D): Vit D, 1,25-Dihydroxy: 106 pg/mL — ABNORMAL HIGH (ref 19.9–79.3)

## 2019-07-21 LAB — CALCIUM, IONIZED: Calcium, Ionized, Serum: 7.5 mg/dL — ABNORMAL HIGH (ref 4.5–5.6)

## 2019-07-21 LAB — ANTIEXTRACTABLE NUCLEAR AG
ENA SM Ab Ser-aCnc: <0.2 AI (ref 0.0–0.9)
Ribonucleic Protein: 4 AI — ABNORMAL HIGH (ref 0.0–0.9)

## 2019-07-21 LAB — SJOGRENS SYNDROME-A EXTRACTABLE NUCLEAR ANTIBODY: SSA (Ro) (ENA) Antibody, IgG: <0.2 AI (ref 0.0–0.9)

## 2019-07-21 MED ORDER — POTASSIUM CHLORIDE CRYS ER 10 MEQ PO TBCR
EXTENDED_RELEASE_TABLET | ORAL | Status: AC
  Filled 2019-07-21: qty 2

## 2019-07-21 MED ORDER — POTASSIUM CHLORIDE CRYS ER 20 MEQ PO TBCR
40.0000 meq | EXTENDED_RELEASE_TABLET | Freq: Once | ORAL | Status: AC
  Administered 2019-07-21: 40 meq via ORAL
  Filled 2019-07-21: qty 2

## 2019-07-21 NOTE — Plan of Care (Signed)
  Problem: Education: Goal: Knowledge of disease and its progression will improve Outcome: Progressing   Problem: Health Behavior/Discharge Planning: Goal: Ability to manage health-related needs will improve Outcome: Progressing   Problem: Fluid Volume: Goal: Fluid volume balance will be maintained or improved Outcome: Progressing   Problem: Nutritional: Goal: Ability to make appropriate dietary choices will improve Outcome: Progressing   Problem: Urinary Elimination: Goal: Progression of disease will be identified and treated Outcome: Progressing

## 2019-07-21 NOTE — Progress Notes (Signed)
[facility] Progress Note    Assessment/ Plan:   1. Abnormal CT chest - in young adult male w/o prior medical problems, in association w/ mild anemia.  PTH is low, vit D low. CT C/A/P with no overt signs of malignancy but with + splenomegaly and bilateral reticulonodular opacities.  No hydro but with tiny nonobstructing calculi.  PSA, AFP, and bHCG unremarkable. ANA, ANCA,complements, ENA, SSA/B all pending, ACEtest elevated.  Pulmonary consulted.  ? ILD vs sarcoid vs other  Appreciate assistance  2. Hypercalcemia - low pth and low vit D, IV NS w/ po lasix 20 bid, also calcitonin for 24- 48 hrs.  Getting better.  Will stop IVFs for now, see if Ca continues to improve with Lasix s/p calcitonin.     3. AKI: UA negative, UP/C low, getting a little better with IVFs and treating Ca.  No indication for biopsy at present 4. HTN - BP better overall.   5. Dispo: if Ca OK off IVFs in the AM can go home with followup with me in clinic.    Subjective:    Better.  Wants to go home.  Ca down to 10.5 this AM.      Objective:   BP (!) 129/93 (BP Location: Left Arm)    Pulse 75    Temp 97.8 F (36.6 C) (Oral)    Resp 19    Ht 6\' 2"  (1.88 m)    Wt 74.9 kg    SpO2 98%    BMI 21.21 kg/m  No intake or output data in the 24 hours ending 07/21/19 1247 Weight change:   Physical Exam: Gen: NAD, lying in bed CVS: RRR, loud S2 Resp: bilateral basilar faint dry crackles Abd: nontender Ext: no LE edema  Imaging: CT ABDOMEN PELVIS WO CONTRAST  Result Date: 07/20/2019 CLINICAL DATA:  34 year old male with history of nausea and vomiting presenting with renal failure and hypercalcemia. Evaluate for potential bony malignancy or metastatic disease. EXAM: CT CHEST, ABDOMEN AND PELVIS WITHOUT CONTRAST TECHNIQUE: Multidetector CT imaging of the chest, abdomen and pelvis was performed following the standard protocol without IV contrast. COMPARISON:  CT of the chest, abdomen and pelvis 08/19/2009.  FINDINGS: CT CHEST FINDINGS Cardiovascular: Heart size is normal. There is no significant pericardial fluid, thickening or pericardial calcification. No atherosclerotic calcifications in the thoracic aorta or the coronary arteries. Mediastinum/Nodes: No pathologically enlarged mediastinal or hilar lymph nodes. Please note that accurate exclusion of hilar adenopathy is limited on noncontrast CT scans. Esophagus is unremarkable in appearance. No axillary lymphadenopathy. Lungs/Pleura: Widespread areas of septal thickening, patchy areas of peripheral bronchiolectasis and extensive thickening of the peribronchovascular interstitium with widespread peribronchovascular ground-glass attenuation and micro nodularity scattered throughout all aspects of the lungs bilaterally, with no definitive craniocaudal gradient. No confluent consolidative airspace disease. No pleural effusions. No larger more suspicious appearing pulmonary nodules or masses are noted. Musculoskeletal: There are no aggressive appearing lytic or blastic lesions noted in the visualized portions of the skeleton. CT ABDOMEN PELVIS FINDINGS Hepatobiliary: No definite suspicious cystic or solid hepatic lesions are confidently identified on today's noncontrast CT examination. Unenhanced appearance of the gallbladder is normal. Pancreas: No definite pancreatic mass or peripancreatic fluid collections or inflammatory changes are noted on today's noncontrast CT examination. Spleen: Spleen is enlarged measuring 15.4 x 8.5 x 22.5 cm (estimated splenic volume of 1,473 mL) . Adrenals/Urinary Tract: Tiny nonobstructive calculi are present in the collecting systems of both kidneys measuring up to 4 mm in  the lower pole collecting system of the left kidney. No additional calculi are noted along the course of either ureter or within the lumen of the urinary bladder. Unenhanced appearance of the kidneys is otherwise normal. No hydroureteronephrosis. Urinary bladder is  unremarkable in appearance. Bilateral adrenal glands are normal in appearance. Stomach/Bowel: Unenhanced appearance of the stomach is normal. No pathologic dilatation of small bowel or colon. The appendix is not confidently identified and may be surgically absent. Regardless, there are no inflammatory changes noted adjacent to the cecum to suggest the presence of an acute appendicitis at this time. Vascular/Lymphatic: No atherosclerotic calcifications in the abdominal aorta or pelvic vasculature. No lymphadenopathy noted in the abdomen or pelvis. Reproductive: Prostate gland and seminal vesicles are unremarkable in appearance. Other: No significant volume of ascites.  No pneumoperitoneum. Musculoskeletal: There are no aggressive appearing lytic or blastic lesions noted in the visualized portions of the skeleton. IMPRESSION: 1. No suspicious osseous lesions to suggest either primary bone malignancy or metastatic disease to the bones. 2. Highly unusual appearance of the lungs, as detailed above, concerning for potential interstitial lung disease. Outpatient referral to Pulmonology for further evaluation is recommended, with consideration for further evaluation with high-resolution chest CT if clinically appropriate. 3. Splenomegaly. 4. Nonobstructive calculi in the collecting systems of both kidneys measuring up to 4 mm in the lower pole collecting system of left kidney. No ureteral stones or findings to suggest urinary tract obstruction. Electronically Signed   By: Trudie Reed M.D.   On: 07/20/2019 08:17   CT CHEST WO CONTRAST  Result Date: 07/20/2019 CLINICAL DATA:  34 year old male with history of nausea and vomiting presenting with renal failure and hypercalcemia. Evaluate for potential bony malignancy or metastatic disease. EXAM: CT CHEST, ABDOMEN AND PELVIS WITHOUT CONTRAST TECHNIQUE: Multidetector CT imaging of the chest, abdomen and pelvis was performed following the standard protocol without  IV contrast. COMPARISON:  CT of the chest, abdomen and pelvis 08/19/2009. FINDINGS: CT CHEST FINDINGS Cardiovascular: Heart size is normal. There is no significant pericardial fluid, thickening or pericardial calcification. No atherosclerotic calcifications in the thoracic aorta or the coronary arteries. Mediastinum/Nodes: No pathologically enlarged mediastinal or hilar lymph nodes. Please note that accurate exclusion of hilar adenopathy is limited on noncontrast CT scans. Esophagus is unremarkable in appearance. No axillary lymphadenopathy. Lungs/Pleura: Widespread areas of septal thickening, patchy areas of peripheral bronchiolectasis and extensive thickening of the peribronchovascular interstitium with widespread peribronchovascular ground-glass attenuation and micro nodularity scattered throughout all aspects of the lungs bilaterally, with no definitive craniocaudal gradient. No confluent consolidative airspace disease. No pleural effusions. No larger more suspicious appearing pulmonary nodules or masses are noted. Musculoskeletal: There are no aggressive appearing lytic or blastic lesions noted in the visualized portions of the skeleton. CT ABDOMEN PELVIS FINDINGS Hepatobiliary: No definite suspicious cystic or solid hepatic lesions are confidently identified on today's noncontrast CT examination. Unenhanced appearance of the gallbladder is normal. Pancreas: No definite pancreatic mass or peripancreatic fluid collections or inflammatory changes are noted on today's noncontrast CT examination. Spleen: Spleen is enlarged measuring 15.4 x 8.5 x 22.5 cm (estimated splenic volume of 1,473 mL) . Adrenals/Urinary Tract: Tiny nonobstructive calculi are present in the collecting systems of both kidneys measuring up to 4 mm in the lower pole collecting system of the left kidney. No additional calculi are noted along the course of either ureter or within the lumen of the urinary bladder. Unenhanced appearance of the  kidneys is otherwise normal. No hydroureteronephrosis. Urinary bladder  is unremarkable in appearance. Bilateral adrenal glands are normal in appearance. Stomach/Bowel: Unenhanced appearance of the stomach is normal. No pathologic dilatation of small bowel or colon. The appendix is not confidently identified and may be surgically absent. Regardless, there are no inflammatory changes noted adjacent to the cecum to suggest the presence of an acute appendicitis at this time. Vascular/Lymphatic: No atherosclerotic calcifications in the abdominal aorta or pelvic vasculature. No lymphadenopathy noted in the abdomen or pelvis. Reproductive: Prostate gland and seminal vesicles are unremarkable in appearance. Other: No significant volume of ascites.  No pneumoperitoneum. Musculoskeletal: There are no aggressive appearing lytic or blastic lesions noted in the visualized portions of the skeleton. IMPRESSION: 1. No suspicious osseous lesions to suggest either primary bone malignancy or metastatic disease to the bones. 2. Highly unusual appearance of the lungs, as detailed above, concerning for potential interstitial lung disease. Outpatient referral to Pulmonology for further evaluation is recommended, with consideration for further evaluation with high-resolution chest CT if clinically appropriate. 3. Splenomegaly. 4. Nonobstructive calculi in the collecting systems of both kidneys measuring up to 4 mm in the lower pole collecting system of left kidney. No ureteral stones or findings to suggest urinary tract obstruction. Electronically Signed   By: Trudie Reedaniel  Entrikin M.D.   On: 07/20/2019 08:17   ECHOCARDIOGRAM COMPLETE  Result Date: 07/20/2019    ECHOCARDIOGRAM REPORT   Patient Name:   Phillip ColasJOHN Kim Date of Exam: 07/20/2019 Medical Rec #:  [id number]         Height:       74.0 in Accession #:    [id number]        Weight:       165.2 lb Date of Birth:  [birth year]         BSA:          2.004 m Patient Age:    34 years           BP:           133/76 mmHg Patient Gender: M                 HR:           73 bpm. Exam Location:  Inpatient Procedure: 2D Echo, Cardiac Doppler and Color Doppler Indications:    R00.8 Other abnormalities of heart beat  History:        Patient has no prior history of Echocardiogram examinations.                 Risk Factors:Hypertension. Renal failure.  Sonographer:    Sheralyn Boatmanina West RDCS Referring Phys: [id number] AVA SWAYZE IMPRESSIONS  1. Normal LV systolic function; mild LVH and LVE; grade 1 diastolic dysfunction.  2. Left ventricular ejection fraction, by estimation, is 55 to 60%. The left ventricle has normal function. The left ventricle has no regional wall motion abnormalities. The left ventricular internal cavity size was mildly dilated. There is mild left ventricular hypertrophy. Left ventricular diastolic parameters are consistent with Grade I diastolic dysfunction (impaired relaxation).  3. Right ventricular systolic function is normal. The right ventricular size is normal.  4. The mitral valve is normal in structure. Trivial mitral valve regurgitation. No evidence of mitral stenosis.  5. The aortic valve is tricuspid. Aortic valve regurgitation is not visualized. No aortic stenosis is present.  6. The inferior vena cava is normal in size with greater than 50% respiratory variability, suggesting right atrial pressure of 3 mmHg. FINDINGS  Left Ventricle: Left ventricular ejection fraction,  by estimation, is 55 to 60%. The left ventricle has normal function. The left ventricle has no regional wall motion abnormalities. The left ventricular internal cavity size was mildly dilated. There is  mild left ventricular hypertrophy. Left ventricular diastolic parameters are consistent with Grade I diastolic dysfunction (impaired relaxation). Right Ventricle: The right ventricular size is normal.Right ventricular systolic function is normal. Left Atrium: Left atrial size was normal in size. Right Atrium: Right atrial size was  normal in size. Pericardium: There is no evidence of pericardial effusion. Mitral Valve: The mitral valve is normal in structure. Normal mobility of the mitral valve leaflets. Trivial mitral valve regurgitation. No evidence of mitral valve stenosis. Tricuspid Valve: The tricuspid valve is normal in structure. Tricuspid valve regurgitation is trivial. No evidence of tricuspid stenosis. Aortic Valve: The aortic valve is tricuspid. Aortic valve regurgitation is not visualized. No aortic stenosis is present. Pulmonic Valve: The pulmonic valve was normal in structure. Pulmonic valve regurgitation is not visualized. No evidence of pulmonic stenosis. Aorta: The aortic root is normal in size and structure. Venous: The inferior vena cava is normal in size with greater than 50% respiratory variability, suggesting right atrial pressure of 3 mmHg. IAS/Shunts: No atrial level shunt detected by color flow Doppler. Additional Comments: Normal LV systolic function; mild LVH and LVE; grade 1 diastolic dysfunction.  LEFT VENTRICLE PLAX 2D LVIDd:         5.35 cm      Diastology LVIDs:         3.89 cm      LV e' lateral:   10.70 cm/s LV PW:         1.09 cm      LV E/e' lateral: 6.0 LV IVS:        0.88 cm      LV e' medial:    6.85 cm/s LVOT diam:     2.10 cm      LV E/e' medial:  9.3 LV SV:         71 LV SV Index:   35 LVOT Area:     3.46 cm  LV Volumes (MOD) LV vol d, MOD A2C: 93.9 ml LV vol d, MOD A4C: 119.0 ml LV vol s, MOD A2C: 35.0 ml LV vol s, MOD A4C: 51.3 ml LV SV MOD A2C:     58.9 ml LV SV MOD A4C:     119.0 ml LV SV MOD BP:      62.0 ml RIGHT VENTRICLE             IVC RV S prime:     12.80 cm/s  IVC diam: 1.59 cm TAPSE (M-mode): 1.7 cm LEFT ATRIUM             Index       RIGHT ATRIUM           Index LA diam:        3.50 cm 1.75 cm/m  RA Area:     14.20 cm LA Vol (A2C):   55.6 ml 27.75 ml/m RA Volume:   35.30 ml  17.62 ml/m LA Vol (A4C):   31.1 ml 15.52 ml/m LA Biplane Vol: 44.4 ml 22.16 ml/m  AORTIC VALVE LVOT Vmax:    126.00 cm/s LVOT Vmean:  85.600 cm/s LVOT VTI:    0.205 m  AORTA Ao Root diam: 3.20 cm MITRAL VALVE MV Area (PHT): 2.91 cm    SHUNTS MV Decel Time: 261 msec    Systemic VTI:  0.20  m MV E velocity: 63.70 cm/s  Systemic Diam: 2.10 cm MV A velocity: 53.30 cm/s MV E/A ratio:  1.20 Phillip Millers MD Electronically signed by Phillip Millers MD Signature Date/Time: 07/20/2019/1:40:44 PM    Final    US Abdomen Limited RUQ  Result Date: 07/20/2019 CLINICAL DATA:  Elevated liver function tests. EXAM: ULTRASOUND ABDOMEN LIMITED RIGHT UPPER QUADRANT COMPARISON:  None. FINDINGS: Gallbladder: No gallstones or wall thickening visualized. No sonographic Murphy sign noted by sonographer. Common bile duct: Diameter: 5.3 mm, normal. Liver: No focal lesion identified. Within normal limits in parenchymal echogenicity. Portal vein is patent on color Doppler imaging with normal direction of blood flow towards the liver. Other: Echogenic right renal parenchyma. IMPRESSION: No acute abnormality. Echogenic right renal parenchyma consistent with renal medical disease as demonstrated on the prior renal ultrasound dated 07/19/2019. Electronically Signed   By: Francene Boyers M.D.   On: 07/20/2019 10:47    Labs: BMET Recent Labs  Lab 07/17/19 1851 07/18/19 1650 07/19/19 1100 07/20/19 0352 07/21/19 0754  NA 138 139 138 138 137  K 3.6 3.0* 3.4* 3.2* 3.1*  CL 103 106 106 111 112*  CO2 24 27 24  19* 17*  GLUCOSE 90 95 100* 102* 82  BUN 18 17 18 17 13   CREATININE 2.37* 2.45* 2.42* 2.23* 1.79*  CALCIUM 12.5* 13.5* 13.2* 11.6* 10.5*  PHOS  --   --  3.0  --   --    CBC Recent Labs  Lab 07/17/19 1851 07/18/19 1650 07/20/19 0352 07/21/19 0754  WBC 5.3 5.0 6.0 4.9  NEUTROABS 3.3 3.6 4.3  --   HGB 12.1* 12.0* 11.7* 11.6*  HCT 35.5* 35.3* 34.7* 34.8*  MCV 79.6* 79.9* 80.7 82.5  PLT 233 232 218 217    Medications:     potassium chloride       amLODipine  5 mg Oral BID   carvedilol  6.25 mg Oral BID WC    furosemide  20 mg Oral BID   hydrALAZINE  25 mg Oral Q6H   tuberculin  5 Units Intradermal Once      07/22/19, MD 07/21/2019, 12:47 PM

## 2019-07-21 NOTE — Progress Notes (Signed)
PROGRESS NOTE  Phillip Kim OQH:[id number] DOB: 1985-04-14 DOA: 07/18/2019 PCP: Christa See, FNP  Brief History   Phillip Kim is a 34 y.o. male with no significant medical history presented with new onset of headaches nausea and vomiting.  Symptom started about 2-3 weeks with initially epigastric pain, intermittent nausea. He went to [employer] last night where he was worked up for hypertensive urgency.  Work-up found he had AKI, and hypercalcemia, but patient left AMA and was instructed to come back if symptoms progress.  Patient came for symptoms have gotten worse and he would like to be reevaluated.  Patient denies the use of drugs, alcohol, smoking.   [facility] were consulted to admit the patient for further evaluation and treatment. He was admitted to a progressive bed. He has been given IV fluids and antihypertensives, as well as pain control and antiemetics. Nephrology has been consulted. The patient has responded to IV lasix with calcitonin with reduction in calcium and blood pressure. Creatinine is only minimally reduced.   Echocardiogram was obtained and demonstrated a normal EF with mild LVH and mild dilatation of the left ventricle. There is grade 1 diastolic dysfunction. No wall motion abnormalities. RV systolic function and size are normal.  CT chest demonstrated nodularity and thickening of the peribronchovascular interstitium. PCCM was consulted. Bronchoscopy may be warranted to differentiate between sarcoidosis and ILD.   The patient's family reports a tendency toward autoimmune disease in the patient's father's side of the family. They also report that in 2014 the patient had some swollen lymph nodes "behind his ears". The family has shared with me their access to the patient's records where his PCP at the time had actually recorded that he had supraclavicular nodes. I have confirmed with the patient that the nodes were actually post-auricular nodes.   He denies  alcohol or illicit drug use.  Consultants  . Nephrology . PCCM  Procedures  . None  Antibiotics   Anti-infectives (From admission, onward)   None     Subjective  The patient is sitting up in bed. He states that he feels very tired. He also feels weak just with walking to the bathroom.   Objective   Vitals:  Vitals:   07/21/19 0750 07/21/19 0758  BP: (!) 129/93 (!) 129/93  Pulse: 75 75  Resp: 19 19  Temp: 97.8 F (36.6 C) 97.8 F (36.6 C)  SpO2: 98% 98%   Exam:  Constitutional:  . The patient is awake, alert, and oriented x 3. No acute distress. Respiratory:  . No increased work of breathing. . No wheezes, rales, or rhonchi . No tactile fremitus Cardiovascular:  . Regular rate and rhythm . No murmurs, ectopy, or gallups. . No lateral PMI. No thrills. Abdomen:  . Abdomen is soft, non-tender, non-distended . No hernias, masses, or organomegaly . Normoactive bowel sounds.  Musculoskeletal:  . No cyanosis, clubbing, or edema Skin:  . No rashes, lesions, ulcers . palpation of skin: no induration or nodules Neurologic:  . CN 2-12 intact . Sensation all 4 extremities intact Psychiatric:  . Mental status o Mood, affect appropriate o Orientation to person, place, time  . judgment and insight appear intact  I have personally reviewed the following:   Today's Data  . Vitals, CMP, CBC, ACE, AFP, B-HGC, Protein electropheresis, P-ANCA, Myeloperoxidase, ENA SM Ab Ser-aCNa, C3 complement, C4 complement, kappa and lambda light chairs, Ro, La, PSA  Imaging  . Renal ultrasound  Cardiology Data  . EKG - sinus tachycardia  with LVH . Echocardiogram - demonstrated a normal EF with mild LVH and mild dilatation of the left ventricle. There is grade 1 diastolic dysfunction. No wall motion abnormalities. RV systolic function and size are normal.  Scheduled Meds: . amLODipine  5 mg Oral BID  . carvedilol  6.25 mg Oral BID WC  . furosemide  20 mg Oral BID  . potassium  chloride      . tuberculin  5 Units Intradermal Once   Continuous Infusions: . sodium chloride      Active Problems:   HTN (hypertension)   AKI (acute kidney injury) ([place])   Hypertensive urgency   LOS: 3 days   A & P  Hypertension emergency: Resolving on lasix, norvasc, carvedilol, and hydralazine. Headache, nausea and vomiting on admission. Now symptoms are resolved, although blood pressures remain elevated. Have increased the dose of hydralazine. IV fluid rate has been reduced. Continue to monitor. ACE I/ARB are not given due to elevated creatinine. I appreciate Dr. Sue Lush assistance. I believe that this hypertension is not new to this patient given the LVH present on EKG.   LVH: Due to long-standing uncontrolled hypertension. Echocardiogram has demonstrated a normal EF with mild LVH and mild dilatation of the left ventricle. There is grade 1 diastolic dysfunction. No wall motion abnormalities. RV systolic function and size are normal.   Renal insufficiency: Only minimally improved. Likely a strong chronic component given the patient's normal CO2 and anemia. Also likely due to autoimmune process. Renal ultrasound shows evidence of medical renal disease. No hydronephrosis, atrophy, or obstructing stones observed.     Hypercalcemia: Resolving on lasix and calcitonin. Concern for hypercalcemia of malignancy, or autoimmune disease such as sarcoidosis with low PTH, elevated Alk Phos, and low vitamin D. I appreciate Dr. Sue Lush assistance.  Hypokalemia: Supplement and monitor.  Pulmonary nodularity with thickening of the peribronchovascular interstitium. PCCM was consulted. Bronchoscopy may be warranted to differentiate between sarcoidosis and ILD. I appreciate pulmonology's assistance.  Elevated Alk Phos and GGT: Will ultrasound right upper quadrant. Pt denies ETOH or illicit drug use.  Elevated serum protein: SPEP has been ordered.  Weakness: Likely due to a combination of abrupt  decrease in blood pressure, hypokalemia, and the patient lying in bed for a couple of days. I will dc hydralazine to allow blood pressures to come up a little. His PCP may undertake slowly decreasing blood pressures to a normal range. Have supplemented potassium, and will recheck it now.   I have seen and examined this patient myself. I have spent 38 minutes in his evaluation and care.   DVT prophylaxis: SCD Code Status:Full Family Communication: Wife at bedside. All questions answered to the best of my abilities. Disposition Plan: The patient is from home. Anticipate discharge to home. Barriers to discharge completion of work up and resolution hypokalemia and hypercalcemia with generalized weakness due to abrupt normalization of blood pressure.   Status is: Inpatient  Remains inpatient appropriate because:IV treatments appropriate due to intensity of illness or inability to take PO   Dispo: The patient is from: Home              Anticipated d/c is to: Home              Anticipated d/c date is: 1 day              Patient currently is not medically stable to d/c.      Selden Noteboom, DO [facility] Direct contact: see [email or web address]  7PM-7AM contact night coverage as above 07/21/2019, 4:08 PM  LOS: 1 day

## 2019-07-21 NOTE — Progress Notes (Signed)
Patient has not ate any food this shift, says it makes him feel nauseous to think about food. Pt did drink of orange juice.  Wife reports that patient appears more lethargic than yesterday.  Patient is able to walk to bathroom but then has to sit down because he is tired.

## 2019-07-21 NOTE — Progress Notes (Signed)
° °  NAME:  Phillip Kim, MRN:  [id number], DOB:  01/25/1986, LOS: 3 ADMISSION DATE:  07/18/2019, CONSULTATION DATE: 07/20/2019 REFERRING MD: Dr. Gerri Lins, CHIEF COMPLAINT: Headaches  Brief History   Patient was admitted to the hospital with a couple of weeks history of headaches, nausea and vomiting. Incidental CT scan chest findings showing thickening of the peribronchovascular interstitium, micronodularity in the lungs  History of present illness   Did have history of epigastric pain, nausea Noted to have hypertensive urgency at [facility] Kidney dysfunction, hypercalcemia noted. 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 No pertinent past medical history. Relatively healthy with no longstanding diagnosis although was found to be hypertensive recently  Significant Hospital Events   Generally better  Consults:  PCCM Nephrology Procedures:  none  Significant Diagnostic Tests:  CT scan of the chest IMPRESSION: 1. No suspicious osseous lesions to suggest either primary bone malignancy or metastatic disease to the bones. 2. Highly unusual appearance of the lungs, as detailed above, concerning for potential interstitial lung disease. Outpatient referral to Pulmonology for further evaluation is recommended, with consideration for further evaluation with high-resolution chest CT if clinically appropriate. 3. Splenomegaly. 4. Nonobstructive calculi in the collecting  systems of both kidneys measuring up to 4 mm in the lower pole collecting system of left kidney. No ureteral stones or findings to suggest urinary tract obstruction.  Micro Data:  SARS coronavirus negative  Antimicrobials:  None  Interim history/subjective:  Headache is better Denies any chest pains or discomfort Denies any shortness of breath  Objective   Blood pressure (!) 129/93, pulse 75, temperature 97.8 F (36.6 C), temperature source Oral, resp. rate 19, height 6\' 2"  (1.88 m), weight 74.9 kg, SpO2 98 %.       No intake or output data in the 24 hours ending 07/21/19 1201 Filed Weights   07/19/19 0007  Weight: 74.9 kg    Examination: Young gentleman does not appear to be in distress, clear breath sounds bilaterally  Resolved Hospital Problem list     Assessment & Plan:  Abnormal CT scan of the chest showing groundglass changes, peribronchovascular prominence, no adenopathy -Concern for interstitial lung disease -Concern for sarcoidosis in the context of hypercalcemia and renal dysfunction -CT scan of the chest was reviewed with the patient, compared with one performed 08/19/2009  Other interstitial lung disease are possibilities although less likely as patient has not had any significant symptoms Never smoker, no illicit drug use  Exposure to drywall dust from remodeling his house, difficult to quantify how much exposure he had  Blood work for connective tissue disease, vasculitic panel requested already  Information material for sarcoidosis, flexible bronchoscopy, interstitial lung disease provided to patients spouse at bedside  He may be followed as outpatient Will make appointment for him to see one of our ILD specialists  08/21/2009, MD [place] PCCM Pager: [phone]

## 2019-07-22 DIAGNOSIS — I5032 Chronic diastolic (congestive) heart failure: Secondary | ICD-10-CM | POA: Diagnosis present

## 2019-07-22 DIAGNOSIS — R918 Other nonspecific abnormal finding of lung field: Secondary | ICD-10-CM | POA: Diagnosis present

## 2019-07-22 DIAGNOSIS — R9389 Abnormal findings on diagnostic imaging of other specified body structures: Secondary | ICD-10-CM

## 2019-07-22 DIAGNOSIS — E876 Hypokalemia: Secondary | ICD-10-CM | POA: Diagnosis present

## 2019-07-22 DIAGNOSIS — I160 Hypertensive urgency: Secondary | ICD-10-CM

## 2019-07-22 LAB — COMPREHENSIVE METABOLIC PANEL
ALT: 23 U/L (ref 0–44)
AST: 22 U/L (ref 15–41)
Albumin: 3.8 g/dL (ref 3.5–5.0)
Alkaline Phosphatase: 133 U/L — ABNORMAL HIGH (ref 38–126)
Anion gap: 8 (ref 5–15)
BUN: 14 mg/dL (ref 6–20)
CO2: 19 mmol/L — ABNORMAL LOW (ref 22–32)
Calcium: 11.3 mg/dL — ABNORMAL HIGH (ref 8.9–10.3)
Chloride: 110 mmol/L (ref 98–111)
Creatinine, Ser: 1.68 mg/dL — ABNORMAL HIGH (ref 0.61–1.24)
GFR calc Af Amer: >60 mL/min (ref >60)
GFR calc non Af Amer: 52 mL/min — ABNORMAL LOW (ref >60)
Glucose, Bld: 88 mg/dL (ref 70–99)
Potassium: 3.1 mmol/L — ABNORMAL LOW (ref 3.5–5.1)
Sodium: 137 mmol/L (ref 135–145)
Total Bilirubin: 0.8 mg/dL (ref 0.3–1.2)
Total Protein: 8.8 g/dL — ABNORMAL HIGH (ref 6.5–8.1)

## 2019-07-22 MED ORDER — POTASSIUM CHLORIDE CRYS ER 20 MEQ PO TBCR
40.0000 meq | EXTENDED_RELEASE_TABLET | Freq: Two times a day (BID) | ORAL | Status: DC
  Administered 2019-07-22: 40 meq via ORAL
  Filled 2019-07-22: qty 2

## 2019-07-22 MED ORDER — AMLODIPINE BESYLATE 10 MG PO TABS: 5.0000 mg | ORAL_TABLET | Freq: Two times a day (BID) | ORAL | 1 refills | Status: DC

## 2019-07-22 MED ORDER — CALCITONIN (SALMON) 200 UNIT/ACT NA SOLN: 1.0000 | Freq: Every day | NASAL | 12 refills | Status: DC

## 2019-07-22 MED ORDER — HYDRALAZINE HCL 10 MG PO TABS: 10.0000 mg | ORAL_TABLET | Freq: Three times a day (TID) | ORAL | 1 refills | Status: DC

## 2019-07-22 MED ORDER — CARVEDILOL 6.25 MG PO TABS: 6.2500 mg | ORAL_TABLET | Freq: Two times a day (BID) | ORAL | 2 refills | Status: DC

## 2019-07-22 MED ORDER — SODIUM CHLORIDE 0.9 % IV SOLN
30.0000 mg | Freq: Once | INTRAVENOUS | Status: AC
  Administered 2019-07-22: 30 mg via INTRAVENOUS
  Filled 2019-07-22: qty 10

## 2019-07-22 MED ORDER — CALCITONIN (SALMON) 200 UNIT/ACT NA SOLN
1.0000 | Freq: Every day | NASAL | Status: DC
  Administered 2019-07-22: 1 via NASAL
  Filled 2019-07-22: qty 3.7

## 2019-07-22 MED ORDER — SODIUM CHLORIDE 0.9 % IV SOLN: INTRAVENOUS | Status: DC

## 2019-07-22 MED FILL — CARVEDILOL 6.25 MG TABLET: 6.25 | 30 days supply | Qty: 60 | Fill #0

## 2019-07-22 MED FILL — AMLODIPINE BESYLATE 10 MG T: 10 | 30 days supply | Qty: 30 | Fill #0

## 2019-07-22 MED FILL — hydrALAZINE HCL 10 MG TABS: 10 | 30 days supply | Qty: 90 | Fill #0

## 2019-07-22 NOTE — TOC Initial Note (Signed)
Transition of Care [facility]) - Initial/Assessment Note    Patient Details  Name: Phillip Kim MRN: [id number] Date of Birth: [birth year]  [facility]) CM/SW Contact:    Beckie Busing, RN Phone Number: [phone]  07/22/2019, 1:35 PM  Clinical Narrative:                 CM consulted to assist patient with PCP and medication assist. Patients MATCH has been completed and MD aware to send meds to [facility] pharmacy. Patiet and wife both state that patient has a PCP Ayesha Rumpf NP at [place] and do not wish for CM to set up appointment with community health and Wellness. CM explained that [facility] has a pharmacy that would benefot the patient is getting meds at discounted prices. Patient still declines stating he just saw his PCP last week and that's who sent him here to the hospital. Patient has been made aware that he should wait for meds to be delivered to the room prior to being discharged. CM will continue to follow.       Expected Discharge Plan: Home/Self Care Barriers to Discharge: Continued Medical Work up   Patient Goals and CMS Choice Patient states their goals for this hospitalization and ongoing recovery are:: Wants to go home   Choice offered to / list presented to : NA  Expected Discharge Plan and Services Expected Discharge Plan: Home/Self Care In-house Referral: NA Discharge Planning Services: CM Consult Post Acute Care Choice: NA Living arrangements for the past 2 months: Single Family Home                 DME Arranged: N/A DME Agency: NA       HH Arranged: NA HH Agency: NA        Prior Living Arrangements/Services Living arrangements for the past 2 months: Single Family Home Lives with:: Spouse Patient language and need for interpreter reviewed:: Yes Do you feel safe going back to the place where you live?: Yes      Need for Family Participation in Patient Care: Yes (Comment) Care giver support system in place?: Yes (comment)   Criminal  Activity/Legal Involvement Pertinent to Current Situation/Hospitalization: No - Comment as needed  Activities of Daily Living Home Assistive Devices/Equipment: None ADL Screening (condition at time of admission) Patient's cognitive ability adequate to safely complete daily activities?: Yes Is the patient deaf or have difficulty hearing?: No Does the patient have difficulty seeing, even when wearing glasses/contacts?: No Does the patient have difficulty concentrating, remembering, or making decisions?: No Patient able to express need for assistance with ADLs?: Yes Does the patient have difficulty dressing or bathing?: Yes Independently performs ADLs?: Yes (appropriate for developmental age) Does the patient have difficulty walking or climbing stairs?: No Weakness of Legs: None Weakness of Arms/Hands: None  Permission Sought/Granted   Permission granted to share information with : No              Emotional Assessment Appearance:: Appears stated age Attitude/Demeanor/Rapport: Gracious Affect (typically observed): Calm, Quiet Orientation: : Oriented to Self, Oriented to Place, Oriented to  Time, Oriented to Situation Alcohol / Substance Use: Not Applicable Psych Involvement: No (comment)  Admission diagnosis:  Hypertensive urgency [I16.0] AKI (acute kidney injury) (HCC) [N17.9] Acute nonintractable headache, unspecified headache type [R51.9] Patient Active Problem List   Diagnosis Date Noted  . HTN (hypertension) 07/18/2019  . AKI (acute kidney injury) (HCC) 07/18/2019  . Hypertensive urgency    PCP:  Ayesha Rumpf, FNP  Pharmacy:   [id number] - [place], [place] [address] Phone: [phone] Fax: [phone]     Social Determinants of Health (SDOH) Interventions    Readmission Risk Interventions No flowsheet data found.

## 2019-07-22 NOTE — Progress Notes (Addendum)
[facility] Progress Note    Assessment/ Plan:   1. Abnormal CT chest - in young adult male w/o prior medical problems, in association w/ mild anemia.  PTH is low, vit D low. CT C/A/P with no overt signs of malignancy but with + splenomegaly and bilateral reticulonodular opacities.  No hydro but with tiny nonobstructing calculi.  PSA, AFP, and bHCG unremarkable. ANA pending,complements WNL, ENA with + ribonucleic protein, SSA/B neg, ACEtest elevated, 1, 25 OH vit D quite elevated pr3 ANCA mildly positive at 9; clinical picture looks to me more like sarcoid.  Pulmonary consulted.  ? ILD vs sarcoid vs other  Appreciate assistance Recommending f/u as OP--will d/w them if they think empiric prednisone at d/c is appropriate.  Addend: discussed with pulm--> silicosis can also cause elevated ACEtest so there is still diagnostic uncertainty, pred would not be advisable in that setting so will not do that at present.  2. Hypercalcemia - low pth and low vit D, IV NS w/ po lasix 20 bid, s/p [place] calcitonin for 24- 48 hrs. Overall better but increased somewhat after calcitonin and IVFs stopped.  Giving pamidronate 30 mg x 1 and calcitonin nasal spray one spray a day.  Continue calcitonin spray at d/c.       3. AKI: UA negative, UP/C low, getting a little better with IVFs and treating Ca.  No indication for biopsy at present esp if plan is to do OP bronch/ biopsy to assess for sarcoid, considering pred as above.  4. HTN - BP better overall on amlodipine and Coreg as well as Lasix and K supps.  Continue these at discharge    5. Dispo: treating slight increase in Ca.  I expect it to improve with the pamidronate and calcitonin combination so I don't think that keeping him here another day is necessary.  He does need close followup which I told him--> he is agreeable.  I am arranging labs with me on Monday and a clinic followup appointment in 2 weeks.  I gave him and his wife our clinic number to call  with any problems/ questions.         Subjective:    Ca up to 11.3 this AM, pamidronate 30 mg x 1 being given right now, calcitonin nasal spray as well.  BP is good, Cr down.  Pt is very eager to go home.     Objective:   BP (!) 133/93 (BP Location: Left Arm)   Pulse 69   Temp 97.8 F (36.6 C)   Resp 16   Ht 6\' 2"  (1.88 m)   Wt 74.9 kg   SpO2 99%   BMI 21.21 kg/m   Intake/Output Summary (Last 24 hours) at 07/22/2019 1231 Last data filed at 07/22/2019 0854 Gross per 24 hour  Intake 840 ml  Output --  Net 840 ml   Weight change:   Physical Exam: Gen: NAD, lying in bed CVS: RRR, loud S2 Resp: bilateral basilar faint dry crackles Abd: nontender Ext: no LE edema  Imaging: ECHOCARDIOGRAM COMPLETE  Result Date: 07/20/2019    ECHOCARDIOGRAM REPORT   Patient Name:   Phillip Kim Date of Exam: 07/20/2019 Medical Rec #:  07/22/2019         Height:       74.0 in Accession #:    [id number]        Weight:       165.2 lb Date of Birth:  02/06/1985  BSA:          2.004 m Patient Age:    34 years          BP:           133/76 mmHg Patient Gender: M                 HR:           73 bpm. Exam Location:  Inpatient Procedure: 2D Echo, Cardiac Doppler and Color Doppler Indications:    R00.8 Other abnormalities of heart beat  History:        Patient has no prior history of Echocardiogram examinations.                 Risk Factors:Hypertension. Renal failure.  Sonographer:    Roseanna Rainbow RDCS Referring Phys: [id number] AVA SWAYZE IMPRESSIONS  1. Normal LV systolic function; mild LVH and LVE; grade 1 diastolic dysfunction.  2. Left ventricular ejection fraction, by estimation, is 55 to 60%. The left ventricle has normal function. The left ventricle has no regional wall motion abnormalities. The left ventricular internal cavity size was mildly dilated. There is mild left ventricular hypertrophy. Left ventricular diastolic parameters are consistent with Grade I diastolic dysfunction (impaired relaxation).   3. Right ventricular systolic function is normal. The right ventricular size is normal.  4. The mitral valve is normal in structure. Trivial mitral valve regurgitation. No evidence of mitral stenosis.  5. The aortic valve is tricuspid. Aortic valve regurgitation is not visualized. No aortic stenosis is present.  6. The inferior vena cava is normal in size with greater than 50% respiratory variability, suggesting right atrial pressure of 3 mmHg. FINDINGS  Left Ventricle: Left ventricular ejection fraction, by estimation, is 55 to 60%. The left ventricle has normal function. The left ventricle has no regional wall motion abnormalities. The left ventricular internal cavity size was mildly dilated. There is  mild left ventricular hypertrophy. Left ventricular diastolic parameters are consistent with Grade I diastolic dysfunction (impaired relaxation). Right Ventricle: The right ventricular size is normal.Right ventricular systolic function is normal. Left Atrium: Left atrial size was normal in size. Right Atrium: Right atrial size was normal in size. Pericardium: There is no evidence of pericardial effusion. Mitral Valve: The mitral valve is normal in structure. Normal mobility of the mitral valve leaflets. Trivial mitral valve regurgitation. No evidence of mitral valve stenosis. Tricuspid Valve: The tricuspid valve is normal in structure. Tricuspid valve regurgitation is trivial. No evidence of tricuspid stenosis. Aortic Valve: The aortic valve is tricuspid. Aortic valve regurgitation is not visualized. No aortic stenosis is present. Pulmonic Valve: The pulmonic valve was normal in structure. Pulmonic valve regurgitation is not visualized. No evidence of pulmonic stenosis. Aorta: The aortic root is normal in size and structure. Venous: The inferior vena cava is normal in size with greater than 50% respiratory variability, suggesting right atrial pressure of 3 mmHg. IAS/Shunts: No atrial level shunt detected by color  flow Doppler. Additional Comments: Normal LV systolic function; mild LVH and LVE; grade 1 diastolic dysfunction.  LEFT VENTRICLE PLAX 2D LVIDd:         5.35 cm      Diastology LVIDs:         3.89 cm      LV e' lateral:   10.70 cm/s LV PW:         1.09 cm      LV E/e' lateral: 6.0 LV IVS:  0.88 cm      LV e' medial:    6.85 cm/s LVOT diam:     2.10 cm      LV E/e' medial:  9.3 LV SV:         71 LV SV Index:   35 LVOT Area:     3.46 cm  LV Volumes (MOD) LV vol d, MOD A2C: 93.9 ml LV vol d, MOD A4C: 119.0 ml LV vol s, MOD A2C: 35.0 ml LV vol s, MOD A4C: 51.3 ml LV SV MOD A2C:     58.9 ml LV SV MOD A4C:     119.0 ml LV SV MOD BP:      62.0 ml RIGHT VENTRICLE             IVC RV S prime:     12.80 cm/s  IVC diam: 1.59 cm TAPSE (M-mode): 1.7 cm LEFT ATRIUM             Index       RIGHT ATRIUM           Index LA diam:        3.50 cm 1.75 cm/m  RA Area:     14.20 cm LA Vol (A2C):   55.6 ml 27.75 ml/m RA Volume:   35.30 ml  17.62 ml/m LA Vol (A4C):   31.1 ml 15.52 ml/m LA Biplane Vol: 44.4 ml 22.16 ml/m  AORTIC VALVE LVOT Vmax:   126.00 cm/s LVOT Vmean:  85.600 cm/s LVOT VTI:    0.205 m  AORTA Ao Root diam: 3.20 cm MITRAL VALVE MV Area (PHT): 2.91 cm    SHUNTS MV Decel Time: 261 msec    Systemic VTI:  0.20 m MV E velocity: 63.70 cm/s  Systemic Diam: 2.10 cm MV A velocity: 53.30 cm/s MV E/A ratio:  1.20 Olga Millers MD Electronically signed by Olga Millers MD Signature Date/Time: 07/20/2019/1:40:44 PM    Final     Labs: BMET Recent Labs  Lab 07/17/19 1851 07/18/19 1650 07/19/19 1100 07/20/19 0352 07/21/19 0754 07/21/19 1619 07/22/19 0323  NA 138 139 138 138 137 137 137  K 3.6 3.0* 3.4* 3.2* 3.1* 3.5 3.1*  CL 103 106 106 111 112* 111 110  CO2 24 27 24  19* 17* 17* 19*  GLUCOSE 90 95 100* 102* 82 96 88  BUN 18 17 18 17 13 13 14   CREATININE 2.37* 2.45* 2.42* 2.23* 1.79* 1.70* 1.68*  CALCIUM 12.5* 13.5* 13.2* 11.6* 10.5* 10.9* 11.3*  PHOS  --   --  3.0  --   --   --   --    CBC Recent Labs   Lab 07/17/19 1851 07/18/19 1650 07/20/19 0352 07/21/19 0754  WBC 5.3 5.0 6.0 4.9  NEUTROABS 3.3 3.6 4.3  --   HGB 12.1* 12.0* 11.7* 11.6*  HCT 35.5* 35.3* 34.7* 34.8*  MCV 79.6* 79.9* 80.7 82.5  PLT 233 232 218 217    Medications:    . amLODipine  5 mg Oral BID  . calcitonin (salmon)  1 spray Alternating Nares Daily  . carvedilol  6.25 mg Oral BID WC  . furosemide  20 mg Oral BID  . potassium chloride  40 mEq Oral BID  . tuberculin  5 Units Intradermal Once      07/22/19, MD 07/22/2019, 12:31 PM
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
## 2019-07-22 NOTE — Progress Notes (Signed)
Pt. Discharged in stable condition. AVS documentation  reviewed. Pt. left via car with wife with belongings.

## 2019-07-22 NOTE — Progress Notes (Signed)
Occupational Therapy Evaluation Only Patient Details Name: Phillip Kim MRN: [id number] DOB: 1986-01-28 Today's Date: 07/22/2019    History of Present Illness Phillip Kim is a 34 y.o. male with no significant medical history presented with new onset of headaches nausea and vomiting.  Symptom started about 2-3 weeks with initially epigastric pain, intermittent nausea. He went to [facility] last night where he was worked up for hypertensive urgency.  Work-up found he had AKI, and hypercalcemia, but patient left AMA and was instructed to come back if symptoms progress.  Patient came for symptoms have gotten worse and he would like to be reevaluated.  Patient denies the use of drugs, alcohol, smoking.    Clinical Impression   PTA, pt Independent in all ADLs, IADLs, and mobility without AD. 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 Pt lives with wife and children. Presently, pt demonstrated ability to complete ADLs, transfers, and hallway distance mobility Independently and without AD with no apparent balance deficits. Assessed vitals after activity with BP at 138/99 (pt reports systolic BP has been running 130s-140s consistently), HR 67bpm, and SpO2 at 100%. Pt denied any dizziness or fatigue after activity and reports feeling better. No skilled OT needs indicated at acute level or upon discharge.     Follow Up Recommendations  No OT follow up    Equipment Recommendations  None recommended by OT    Recommendations for Other Services       Precautions / Restrictions Precautions Precautions: None Restrictions Weight Bearing Restrictions: No      Mobility Bed Mobility Overal bed mobility: Independent             General bed mobility comments: Independent  Transfers Overall transfer level: Independent Equipment used: None             General transfer comment: Independent for sit to stand at bedside, ambulation in hallway and stand pivot back to  bed    Balance Overall balance assessment: No apparent balance deficits (not formally assessed)                                         ADL either performed or assessed with clinical judgement   ADL Overall ADL's : Independent                                       General ADL Comments: Independent for donning socks in standing, no LOB. Independent for hallway distance mobility with moderate speed pace, able to look side to side without LOB     Vision Baseline Vision/History: No visual deficits Vision Assessment?: No apparent visual deficits     Perception     Praxis      Pertinent Vitals/Pain Pain Assessment: No/denies pain     Hand Dominance Right   Extremity/Trunk Assessment Upper Extremity Assessment Upper Extremity Assessment: Overall WFL for tasks assessed   Lower Extremity Assessment Lower Extremity Assessment: Defer to PT evaluation       Communication Communication Communication: No difficulties   Cognition Arousal/Alertness: Awake/alert Behavior During Therapy: WFL for tasks assessed/performed Overall Cognitive Status: Within Functional Limits for tasks assessed  General Comments  Pt reports elevated BP on arrival to ED (200s) and reported inability to detect raising BP. After activity during OT eval, assessed BP at 138/99 (pt reports has been running 193X-902I systolic consistently), HR 67, and 100% O2 stats. Pt denied any fatigue or dizziness after activity     Exercises     Shoulder Instructions      Home Living Family/patient expects to be discharged to:: Private residence Living Arrangements: Spouse/significant other Available Help at Discharge: Family Type of Home: House Home Access: Level entry     Home Layout: One level     Bathroom Shower/Tub: Teacher, early years/pre: Standard     Home Equipment: None          Prior  Functioning/Environment Level of Independence: Independent        Comments: Independent with all ADLs, IADls, and mobility. 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 [employer]        OT Problem List:        OT Treatment/Interventions:      OT Goals(Current goals can be found in the care plan section) Acute Rehab OT Goals Patient Stated Goal: go home soon  OT Frequency:     Barriers to D/C:            Co-evaluation              AM-PAC OT "6 Clicks" Daily Activity     Outcome Measure Help from another person eating meals?: None Help from another person taking care of personal grooming?: None Help from another person toileting, which includes using toliet, bedpan, or urinal?: None Help from another person bathing (including washing, rinsing, drying)?: None Help from another person to put on and taking off regular upper body clothing?: None Help from another person to put on and taking off regular lower body clothing?: None 6 Click Score: 24   End of Session Nurse Communication: Mobility status;Other (comment) (vitals)  Activity Tolerance: Patient tolerated treatment well Patient left: in bed;with call bell/phone within reach                   Time: 0973-5329 OT Time Calculation (min): 15 min Charges:  OT General Charges $OT Visit: 1 Visit OT Evaluation $OT Eval Low Complexity: 1 Low  Layla Maw, OTR/L  Layla Maw 07/22/2019, 9:03 AM

## 2019-07-22 NOTE — TOC Progression Note (Signed)
Transition of Care [facility]) - Progression Note    Patient Details  Name: Phillip Kim MRN: [id number] Date of Birth: [birth year]  [facility] CM/SW Contact  Beckie Busing, RN Phone Number: [phone]   07/22/2019, 12:33 PM  Clinical Narrative:    MATCH form completed for patient with no insurance. MD has been made aware to send meds to [facility] pharmacy for delivery to room.          Expected Discharge Plan and Services                                                 Social Determinants of Health (SDOH) Interventions    Readmission Risk Interventions No flowsheet data found.

## 2019-07-22 NOTE — Discharge Summary (Signed)
Discharge Summary  Phillip Kim XNA:[id number] DOB: 03/08/1985  PCP: Ayesha Rumpf, FNP  Admit date: 07/18/2019 Discharge date: 07/22/2019  Time spent: 25 minutes  Recommendations for Outpatient Follow-up:  1. New medication: Coreg 6.25 p.o. twice daily 2. New medication: Hydralazine 10 mg p.o. 3 times daily 3. New medication: Norvasc 10 mg, take half tablet twice a day 4. New medication: Calcitonin spray, take 1 spray daily, alternating nostrils 5. Patient will follow up with nephrology in 2 weeks.  He will have his blood work checked on Monday 6/28 prior to visit. 6. Patient will follow up with his PCP in the next 1 month. 7. Patient will follow up with pulmonary on 7/14  Discharge Diagnoses:  Active Hospital Problems   Diagnosis Date Noted  . AKI (acute kidney injury) (HCC) 07/18/2019  . Hypocalcemia 07/22/2019  . Hypokalemia 07/22/2019  . Abnormal CT scan of lung 07/22/2019  . HTN (hypertension) 07/18/2019  . Hypertensive urgency     Resolved Hospital Problems  No resolved problems to display.    Discharge Condition: Improved, being discharged home  Diet recommendation: Low-sodium  Vitals:   07/21/19 2131 07/22/19 0740  BP: 127/89 (!) 133/93  Pulse: 68 69  Resp:  16  Temp: 98.1 F (36.7 C) 97.8 F (36.6 C)  SpO2: 97% 99%    History of present illness:  34 year old male with no significant past medical history presented with new onset of headaches plus nausea and vomiting on 6/19.  Symptoms had started 2 to 3 weeks earlier along with epigastric pain and nausea.  He had been to [facility] ER the day prior where he was worked up and found to have hypertensive urgency as well as acute kidney injury and hypercalcemia, but he left AMA and was instructed to come back if symptoms progress.  Patient was sent over from his primary care doctor's office and admitted to the hospitalist service.  Hospital Course:  Principal Problem:   AKI (acute kidney injury)  (HCC): At time of admission, creatinine at 2.45 with GFR of 33.  Patient started on IV fluids as well as antihypertensives.  Seen by nephrology.  UA negative.  Urine protein creatinine low, improving with fluids and treating his hypercalcemia.  No indication for biopsy at this time.  Active Problems:   HTN (hypertension) essential with hypertensive urgency: Patient underwent echocardiogram which noted normal ejection fraction with mild LVH and grade 1 diastolic dysfunction.  Started on antihypertensive agents.On Norvasc plus hydralazine and Coreg plus Lasix.  Pressure has steadily improved is currently stable with a systolic in the 120s.  Patient does not have insurance and so medications upon discharge are being set up with the transitions of care pharmacy.  Patient would like to stay with his PCP although he does not have insurance.  I advised him that if his medications continue to be an issue, his PCP can discuss with the community [facility] wellness center about getting his medications G/40.   Hypercalcemia: Low PTH and low vitamin D.  Patient put on saline plus received p.o. Lasix and subcu calcitonin.  Improved, but still increased, especially after calcitonin and fluids stopped.  Patient received 1 dose of pamidronate and will be discharged on calcitonin nasal spray.  Nephrology will continue to follow as outpatient.    Hypokalemia: Replaced, likely in the setting of renal disease as well as continued saline.  Labs to be rechecked on Monday    Abnormal CT scan of lung: Patient underwent a CT scan which  noted nodularity and thickening of the peribronchiolar interstitium.  Also noted to have splenomegaly.  Lab work noted noting negative SSA/B, ENA and complements.  ANA pending.  Positive ribonucleoprotein.  ACE testing is elevated.  PR-3 ANCA mildly positive.  Silicosis can cause elevated ACE test, so not confirmed to be sarcoidosis.  Pulmonary consulted.  Suspected interstitial lung disease versus  sarcoidosis.  Recommendation is for bronchoscopy which can be done as an outpatient.  Patient has a follow-up appointment scheduled with pulmonary on 7/14.  Chronic diastolic heart failure: Patient looks to be overall euvolemic.  Noted on echocardiogram.  Started on Coreg.  No ACE inhibitor due to renal dysfunction.  Monitoring closely   Procedures:  None  Consultations:  Nephrology  Pulmonary  Discharge Exam: BP (!) 133/93 (BP Location: Left Arm)   Pulse 69   Temp 97.8 F (36.6 C)   Resp 16   Ht 6\' 2"  (1.88 m)   Wt 74.9 kg   SpO2 99%   BMI 21.21 kg/m   General: Alert and oriented x3, no acute distress Cardiovascular: Regular rate and rhythm, S1-S2 Respiratory: Clear to auscultation bilaterally  Discharge Instructions You were cared for by a hospitalist during your hospital stay. If you have any questions about your discharge medications or the care you received while you were in the hospital after you are discharged, you can call the unit and asked to speak with the hospitalist on call if the hospitalist that took care of you is not available. Once you are discharged, your primary care physician will handle any further medical issues. Please note that NO REFILLS for any discharge medications will be authorized once you are discharged, as it is imperative that you return to your primary care physician (or establish a relationship with a primary care physician if you do not have one) for your aftercare needs so that they can reassess your need for medications and monitor your lab values.  Discharge Instructions    Diet - low sodium heart healthy   Complete by: As directed    Increase activity slowly   Complete by: As directed      Allergies as of 07/22/2019   No Known Allergies     Medication List    STOP taking these medications   ibuprofen 200 MG tablet Commonly known as: ADVIL     TAKE these medications   amLODipine 10 MG tablet Commonly known as: NORVASC Take  0.5 tablets (5 mg total) by mouth 2 (two) times daily. What changed:   medication strength  when to take this  additional instructions   calcitonin (salmon) 200 UNIT/ACT nasal spray Commonly known as: MIACALCIN/FORTICAL Place 1 spray into alternate nostrils daily. Start taking on: July 23, 2019   carvedilol 6.25 MG tablet Commonly known as: COREG Take 1 tablet (6.25 mg total) by mouth 2 (two) times daily with a meal.   hydrALAZINE 10 MG tablet Commonly known as: APRESOLINE Take 1 tablet (10 mg total) by mouth 3 (three) times daily.      No Known Allergies  Follow-up Information    Chilton GreathouseMannam, Praveen, MD Follow up on 09/01/2019.   Specialty: Pulmonary Disease Why: 10 am appointment Contact information: [address] [place] [address] [phone]        Bevelyn NgoGroce, Sarah F, NP Follow up on 08/12/2019.   Specialty: Pulmonary Disease Why: 3:30 Hospital Follow up Contact information: [address] [place] [address] [phone]  Bufford Buttner, MD Follow up in 2 week(s).   Specialty: Nephrology Why: Office will call you with appointment.  Call them if you have not heard from them by Friday.   Also, report to office on Monday, 6/28 for lab work Contact information: [address] [place] [address] [phone]                The results of significant diagnostics from this hospitalization (including imaging, microbiology, ancillary and laboratory) are listed below for reference.    Significant Diagnostic Studies: CT ABDOMEN PELVIS WO CONTRAST  Result Date: 07/20/2019 CLINICAL DATA:  34 year old male with history of nausea and vomiting presenting with renal failure and hypercalcemia. Evaluate for potential bony malignancy or metastatic disease. EXAM: CT CHEST, ABDOMEN AND PELVIS WITHOUT CONTRAST TECHNIQUE: Multidetector CT imaging of the chest, abdomen and pelvis was performed following the standard protocol without IV  contrast. COMPARISON:  CT of the chest, abdomen and pelvis 08/19/2009. FINDINGS: CT CHEST FINDINGS Cardiovascular: Heart size is normal. There is no significant pericardial fluid, thickening or pericardial calcification. No atherosclerotic calcifications in the thoracic aorta or the coronary arteries. Mediastinum/Nodes: No pathologically enlarged mediastinal or hilar lymph nodes. Please note that accurate exclusion of hilar adenopathy is limited on noncontrast CT scans. Esophagus is unremarkable in appearance. No axillary lymphadenopathy. Lungs/Pleura: Widespread areas of septal thickening, patchy areas of peripheral bronchiolectasis and extensive thickening of the peribronchovascular interstitium with widespread peribronchovascular ground-glass attenuation and micro nodularity scattered throughout all aspects of the lungs bilaterally, with no definitive craniocaudal gradient. No confluent consolidative airspace disease. No pleural effusions. No larger more suspicious appearing pulmonary nodules or masses are noted. Musculoskeletal: There are no aggressive appearing lytic or blastic lesions noted in the visualized portions of the skeleton. CT ABDOMEN PELVIS FINDINGS Hepatobiliary: No definite suspicious cystic or solid hepatic lesions are confidently identified on today's noncontrast CT examination. Unenhanced appearance of the gallbladder is normal. Pancreas: No definite pancreatic mass or peripancreatic fluid collections or inflammatory changes are noted on today's noncontrast CT examination. Spleen: Spleen is enlarged measuring 15.4 x 8.5 x 22.5 cm (estimated splenic volume of 1,473 mL) . Adrenals/Urinary Tract: Tiny nonobstructive calculi are present in the collecting systems of both kidneys measuring up to 4 mm in the lower pole collecting system of the left kidney. No additional calculi are noted along the course of either ureter or within the lumen of the urinary bladder. Unenhanced appearance of the kidneys  is otherwise normal. No hydroureteronephrosis. Urinary bladder is unremarkable in appearance. Bilateral adrenal glands are normal in appearance. Stomach/Bowel: Unenhanced appearance of the stomach is normal. No pathologic dilatation of small bowel or colon. The appendix is not confidently identified and may be surgically absent. Regardless, there are no inflammatory changes noted adjacent to the cecum to suggest the presence of an acute appendicitis at this time. Vascular/Lymphatic: No atherosclerotic calcifications in the abdominal aorta or pelvic vasculature. No lymphadenopathy noted in the abdomen or pelvis. Reproductive: Prostate gland and seminal vesicles are unremarkable in appearance. Other: No significant volume of ascites.  No pneumoperitoneum. Musculoskeletal: There are no aggressive appearing lytic or blastic lesions noted in the visualized portions of the skeleton. IMPRESSION: 1. No suspicious osseous lesions to suggest either primary bone malignancy or metastatic disease to the bones. 2. Highly unusual appearance of the lungs, as detailed above, concerning for potential interstitial lung disease. Outpatient referral to Pulmonology for further evaluation is recommended, with consideration for further evaluation with high-resolution chest CT if clinically appropriate.  3. Splenomegaly. 4. Nonobstructive calculi in the collecting systems of both kidneys measuring up to 4 mm in the lower pole collecting system of left kidney. No ureteral stones or findings to suggest urinary tract obstruction. Electronically Signed   By: Vinnie Langton M.D.   On: 07/20/2019 08:17   DG Chest 2 View  Result Date: 07/17/2019 CLINICAL DATA:  Fatigue and cough. Referred by primary care for abnormal labs. EXAM: CHEST - 2 VIEW COMPARISON:  Radiograph 09/18/2017 FINDINGS: The cardiomediastinal contours are normal. Diffuse peribronchial thickening that is similar to 2019 exam. Pulmonary vasculature is normal. No consolidation,  pleural effusion, or pneumothorax. No acute osseous abnormalities are seen. IMPRESSION: Diffuse peribronchial thickening that is chronic and similar to 2019 exam, underlying etiology is uncertain. No new findings. Electronically Signed   By: Keith Rake M.D.   On: 07/17/2019 20:13   CT Head Wo Contrast  Result Date: 07/17/2019 CLINICAL DATA:  Intermittent headache for 3 weeks with right eye visual disturbance. EXAM: CT HEAD WITHOUT CONTRAST TECHNIQUE: Contiguous axial images were obtained from the base of the skull through the vertex without intravenous contrast. COMPARISON:  08/20/2009 FINDINGS: Brain: There is no evidence of acute infarct, intracranial hemorrhage, mass, midline shift, or extra-axial fluid collection. The ventricles and sulci are normal. Small subdural hematoma over the tentorium on the 2011 study has resolved. Vascular: No hyperdense vessel. Skull: No fracture or suspicious osseous lesion. Sinuses/Orbits: Moderate bilateral ethmoid air cell mucosal thickening. Clear mastoid air cells. Unremarkable orbits. Other: None. IMPRESSION: Unremarkable CT appearance of the brain. Electronically Signed   By: Logan Bores M.D.   On: 07/17/2019 20:11   CT CHEST WO CONTRAST  Result Date: 07/20/2019 CLINICAL DATA:  34 year old male with history of nausea and vomiting presenting with renal failure and hypercalcemia. Evaluate for potential bony malignancy or metastatic disease. EXAM: CT CHEST, ABDOMEN AND PELVIS WITHOUT CONTRAST TECHNIQUE: Multidetector CT imaging of the chest, abdomen and pelvis was performed following the standard protocol without IV contrast. COMPARISON:  CT of the chest, abdomen and pelvis 08/19/2009. FINDINGS: CT CHEST FINDINGS Cardiovascular: Heart size is normal. There is no significant pericardial fluid, thickening or pericardial calcification. No atherosclerotic calcifications in the thoracic aorta or the coronary arteries. Mediastinum/Nodes: No pathologically  enlarged mediastinal or hilar lymph nodes. Please note that accurate exclusion of hilar adenopathy is limited on noncontrast CT scans. Esophagus is unremarkable in appearance. No axillary lymphadenopathy. Lungs/Pleura: Widespread areas of septal thickening, patchy areas of peripheral bronchiolectasis and extensive thickening of the peribronchovascular interstitium with widespread peribronchovascular ground-glass attenuation and micro nodularity scattered throughout all aspects of the lungs bilaterally, with no definitive craniocaudal gradient. No confluent consolidative airspace disease. No pleural effusions. No larger more suspicious appearing pulmonary nodules or masses are noted. Musculoskeletal: There are no aggressive appearing lytic or blastic lesions noted in the visualized portions of the skeleton. CT ABDOMEN PELVIS FINDINGS Hepatobiliary: No definite suspicious cystic or solid hepatic lesions are confidently identified on today's noncontrast CT examination. Unenhanced appearance of the gallbladder is normal. Pancreas: No definite pancreatic mass or peripancreatic fluid collections or inflammatory changes are noted on today's noncontrast CT examination. Spleen: Spleen is enlarged measuring 15.4 x 8.5 x 22.5 cm (estimated splenic volume of 1,473 mL) . Adrenals/Urinary Tract: Tiny nonobstructive calculi are present in the collecting systems of both kidneys measuring up to 4 mm in the lower pole collecting system of the left kidney. No additional calculi are noted along the course of either ureter or within the  lumen of the urinary bladder. Unenhanced appearance of the kidneys is otherwise normal. No hydroureteronephrosis. Urinary bladder is unremarkable in appearance. Bilateral adrenal glands are normal in appearance. Stomach/Bowel: Unenhanced appearance of the stomach is normal. No pathologic dilatation of small bowel or colon. The appendix is not confidently identified and may be surgically absent.  Regardless, there are no inflammatory changes noted adjacent to the cecum to suggest the presence of an acute appendicitis at this time. Vascular/Lymphatic: No atherosclerotic calcifications in the abdominal aorta or pelvic vasculature. No lymphadenopathy noted in the abdomen or pelvis. Reproductive: Prostate gland and seminal vesicles are unremarkable in appearance. Other: No significant volume of ascites.  No pneumoperitoneum. Musculoskeletal: There are no aggressive appearing lytic or blastic lesions noted in the visualized portions of the skeleton. IMPRESSION: 1. No suspicious osseous lesions to suggest either primary bone malignancy or metastatic disease to the bones. 2. Highly unusual appearance of the lungs, as detailed above, concerning for potential interstitial lung disease. Outpatient referral to Pulmonology for further evaluation is recommended, with consideration for further evaluation with high-resolution chest CT if clinically appropriate. 3. Splenomegaly. 4. Nonobstructive calculi in the collecting systems of both kidneys measuring up to 4 mm in the lower pole collecting system of left kidney. No ureteral stones or findings to suggest urinary tract obstruction. Electronically Signed   By: Trudie Reed M.D.   On: 07/20/2019 08:17   US RENAL  Result Date: 07/19/2019 CLINICAL DATA:  Initial evaluation for acute kidney injury. EXAM: RENAL / URINARY TRACT ULTRASOUND COMPLETE COMPARISON:  Prior CT from 08/19/2009. FINDINGS: Right Kidney: Renal measurements: 11.6 x 4.7 x 6.0 cm = volume: 170.9 mL. Increased echogenicity seen within the renal parenchyma. No nephrolithiasis or hydronephrosis. No focal renal mass. Left Kidney: Renal measurements: 13.3 x 5.7 x 4.9 cm = volume: 192.6 mL. Diffusely increased echogenicity seen within the renal parenchyma. No nephrolithiasis or hydronephrosis. No focal renal mass. Bladder: Appears normal for degree of bladder distention. Other: Mild splenomegaly noted.  IMPRESSION: 1. Increased echogenicity within the renal parenchyma, consistent with medical renal disease. 2. No hydronephrosis. 3. Splenomegaly. Electronically Signed   By: Rise Mu M.D.   On: 07/19/2019 07:53   ECHOCARDIOGRAM COMPLETE  Result Date: 07/20/2019    ECHOCARDIOGRAM REPORT   Patient Name:   DEANNA BOEHLKE Date of Exam: 07/20/2019 Medical Rec #:  [id number]         Height:       74.0 in Accession #:    [id number]        Weight:       165.2 lb Date of Birth:  1985-02-15         BSA:          2.004 m Patient Age:    34 years          BP:           133/76 mmHg Patient Gender: M                 HR:           73 bpm. Exam Location:  Inpatient Procedure: 2D Echo, Cardiac Doppler and Color Doppler Indications:    R00.8 Other abnormalities of heart beat  History:        Patient has no prior history of Echocardiogram examinations.                 Risk Factors:Hypertension. Renal failure.  Sonographer:    Sheralyn Boatman RDCS Referring Phys: [id number] AVA  SWAYZE IMPRESSIONS  1. Normal LV systolic function; mild LVH and LVE; grade 1 diastolic dysfunction.  2. Left ventricular ejection fraction, by estimation, is 55 to 60%. The left ventricle has normal function. The left ventricle has no regional wall motion abnormalities. The left ventricular internal cavity size was mildly dilated. There is mild left ventricular hypertrophy. Left ventricular diastolic parameters are consistent with Grade I diastolic dysfunction (impaired relaxation).  3. Right ventricular systolic function is normal. The right ventricular size is normal.  4. The mitral valve is normal in structure. Trivial mitral valve regurgitation. No evidence of mitral stenosis.  5. The aortic valve is tricuspid. Aortic valve regurgitation is not visualized. No aortic stenosis is present.  6. The inferior vena cava is normal in size with greater than 50% respiratory variability, suggesting right atrial pressure of 3 mmHg. FINDINGS  Left Ventricle: Left  ventricular ejection fraction, by estimation, is 55 to 60%. The left ventricle has normal function. The left ventricle has no regional wall motion abnormalities. The left ventricular internal cavity size was mildly dilated. There is  mild left ventricular hypertrophy. Left ventricular diastolic parameters are consistent with Grade I diastolic dysfunction (impaired relaxation). Right Ventricle: The right ventricular size is normal.Right ventricular systolic function is normal. Left Atrium: Left atrial size was normal in size. Right Atrium: Right atrial size was normal in size. Pericardium: There is no evidence of pericardial effusion. Mitral Valve: The mitral valve is normal in structure. Normal mobility of the mitral valve leaflets. Trivial mitral valve regurgitation. No evidence of mitral valve stenosis. Tricuspid Valve: The tricuspid valve is normal in structure. Tricuspid valve regurgitation is trivial. No evidence of tricuspid stenosis. Aortic Valve: The aortic valve is tricuspid. Aortic valve regurgitation is not visualized. No aortic stenosis is present. Pulmonic Valve: The pulmonic valve was normal in structure. Pulmonic valve regurgitation is not visualized. No evidence of pulmonic stenosis. Aorta: The aortic root is normal in size and structure. Venous: The inferior vena cava is normal in size with greater than 50% respiratory variability, suggesting right atrial pressure of 3 mmHg. IAS/Shunts: No atrial level shunt detected by color flow Doppler. Additional Comments: Normal LV systolic function; mild LVH and LVE; grade 1 diastolic dysfunction.  LEFT VENTRICLE PLAX 2D LVIDd:         5.35 cm      Diastology LVIDs:         3.89 cm      LV e' lateral:   10.70 cm/s LV PW:         1.09 cm      LV E/e' lateral: 6.0 LV IVS:        0.88 cm      LV e' medial:    6.85 cm/s LVOT diam:     2.10 cm      LV E/e' medial:  9.3 LV SV:         71 LV SV Index:   35 LVOT Area:     3.46 cm  LV Volumes (MOD) LV vol d, MOD A2C:  93.9 ml LV vol d, MOD A4C: 119.0 ml LV vol s, MOD A2C: 35.0 ml LV vol s, MOD A4C: 51.3 ml LV SV MOD A2C:     58.9 ml LV SV MOD A4C:     119.0 ml LV SV MOD BP:      62.0 ml RIGHT VENTRICLE             IVC RV S prime:  12.80 cm/s  IVC diam: 1.59 cm TAPSE (M-mode): 1.7 cm LEFT ATRIUM             Index       RIGHT ATRIUM           Index LA diam:        3.50 cm 1.75 cm/m  RA Area:     14.20 cm LA Vol (A2C):   55.6 ml 27.75 ml/m RA Volume:   35.30 ml  17.62 ml/m LA Vol (A4C):   31.1 ml 15.52 ml/m LA Biplane Vol: 44.4 ml 22.16 ml/m  AORTIC VALVE LVOT Vmax:   126.00 cm/s LVOT Vmean:  85.600 cm/s LVOT VTI:    0.205 m  AORTA Ao Root diam: 3.20 cm MITRAL VALVE MV Area (PHT): 2.91 cm    SHUNTS MV Decel Time: 261 msec    Systemic VTI:  0.20 m MV E velocity: 63.70 cm/s  Systemic Diam: 2.10 cm MV A velocity: 53.30 cm/s MV E/A ratio:  1.20 Olga Millers MD Electronically signed by Olga Millers MD Signature Date/Time: 07/20/2019/1:40:44 PM    Final    US Abdomen Limited RUQ  Result Date: 07/20/2019 CLINICAL DATA:  Elevated liver function tests. EXAM: ULTRASOUND ABDOMEN LIMITED RIGHT UPPER QUADRANT COMPARISON:  None. FINDINGS: Gallbladder: No gallstones or wall thickening visualized. No sonographic Murphy sign noted by sonographer. Common bile duct: Diameter: 5.3 mm, normal. Liver: No focal lesion identified. Within normal limits in parenchymal echogenicity. Portal vein is patent on color Doppler imaging with normal direction of blood flow towards the liver. Other: Echogenic right renal parenchyma. IMPRESSION: No acute abnormality. Echogenic right renal parenchyma consistent with renal medical disease as demonstrated on the prior renal ultrasound dated 07/19/2019. Electronically Signed   By: Francene Boyers M.D.   On: 07/20/2019 10:47    Microbiology: Recent Results (from the past 240 hour(s))  SARS Coronavirus 2 by RT PCR (hospital order, performed in [facility] hospital lab) Nasopharyngeal Nasopharyngeal Swab      Status: None   Collection Time: 07/18/19  5:00 PM   Specimen: Nasopharyngeal Swab  Result Value Ref Range Status   SARS Coronavirus 2 NEGATIVE NEGATIVE Final    Comment: (NOTE) SARS-CoV-2 target nucleic acids are NOT DETECTED.  The SARS-CoV-2 RNA is generally detectable in upper and lower respiratory specimens during the acute phase of infection. The lowest concentration of SARS-CoV-2 viral copies this assay can detect is 250 copies / mL. A negative result does not preclude SARS-CoV-2 infection and should not be used as the sole basis for treatment or other patient management decisions.  A negative result may occur with improper specimen collection / handling, submission of specimen other than nasopharyngeal swab, presence of viral mutation(s) within the areas targeted by this assay, and inadequate number of viral copies (<250 copies / mL). A negative result must be combined with clinical observations, patient history, and epidemiological information.  Fact Sheet for Patients:   BoilerBrush.com.cy  Fact Sheet for Healthcare Providers: https://pope.com/  This test is not yet approved or  cleared by the [place] FDA and has been authorized for detection and/or diagnosis of SARS-CoV-2 by FDA under an Emergency Use Authorization (EUA).  This EUA will remain in effect (meaning this test can be used) for the duration of the COVID-19 declaration under Section 564(b)(1) of the Act, 21 U.S.C. section 360bbb-3(b)(1), unless the authorization is terminated or revoked sooner.  Performed at [facility], [address] [place], [place] [address]  Labs: Basic Metabolic Panel: Recent Labs  Lab 07/19/19 1100 07/20/19 0352 07/21/19 0754 07/21/19 1619 07/22/19 0323  NA 138 138 137 137 137  K 3.4* 3.2* 3.1* 3.5 3.1*  CL 106 111 112* 111 110  CO2 24 19* 17* 17* 19*  GLUCOSE 100* 102* 82 96 88  BUN CREATININE 2.42* 2.23* 1.79* 1.70* 1.68*  CALCIUM 13.2* 11.6* 10.5* 10.9* 11.3*  PHOS 3.0  --   --   --   --    Liver Function Tests: Recent Labs  Lab 07/17/19 1851 07/19/19 1100 07/20/19 0352 07/21/19 0754 07/22/19 0323  AST ALT ALKPHOS 158* 144* 146* 125 133*  BILITOT 0.6 0.3 0.6 0.8 0.8  PROT 9.8* 9.1* 8.5* 8.2* 8.8*  ALBUMIN 4.2 3.8 3.6 3.5 3.8   No results for input(s): LIPASE, AMYLASE in the last 168 hours. No results for input(s): AMMONIA in the last 168 hours. CBC: Recent Labs  Lab 07/17/19 1851 07/18/19 1650 07/20/19 0352 07/21/19 0754  WBC 5.3 5.0 6.0 4.9  NEUTROABS 3.3 3.6 4.3  --   HGB 12.1* 12.0* 11.7* 11.6*  HCT 35.5* 35.3* 34.7* 34.8*  MCV 79.6* 79.9* 80.7 82.5  PLT 233 232 218 217   Cardiac Enzymes: No results for input(s): CKTOTAL, CKMB, CKMBINDEX, TROPONINI in the last 168 hours. BNP: BNP (last 3 results) No results for input(s): BNP in the last 8760 hours.  ProBNP (last 3 results) No results for input(s): PROBNP in the last 8760 hours.  CBG: No results for input(s): GLUCAP in the last 168 hours.     Signed:  Hollice Espy, MD [facility] 07/22/2019, 2:29 PM

## 2019-07-22 NOTE — Progress Notes (Signed)
° °  NAME:  Phillip Kim, MRN:  [id number], DOB:  [birth year], LOS: 4 ADMISSION DATE:  07/18/2019, CONSULTATION DATE: 07/20/2019 REFERRING MD: Dr. Gerri Lins, CHIEF COMPLAINT: Headaches  Brief History   Patient was admitted to the hospital with a couple of weeks history of headaches, nausea and vomiting. Incidental CT scan chest findings showing thickening of the peribronchovascular interstitium, micronodularity in the lungs  History of present illness   Did have history of epigastric pain, nausea Noted to have hypertensive urgency at [facility] Kidney dysfunction, hypercalcemia noted. 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 No pertinent past medical history. Relatively healthy with no longstanding diagnosis although was found to be hypertensive recently  Significant Hospital Events     Consults:  PCCM Nephrology Procedures:  none  Significant Diagnostic Tests:  CT scan of the chest IMPRESSION: 1. No suspicious osseous lesions to suggest either primary bone malignancy or metastatic disease to the bones. 2. Highly unusual appearance of the lungs, as detailed above, concerning for potential interstitial lung disease. Outpatient referral to Pulmonology for further evaluation is recommended, with consideration for further evaluation with high-resolution chest CT if clinically appropriate. 3. Splenomegaly. 4. Nonobstructive calculi in the collecting systems of both  kidneys measuring up to 4 mm in the lower pole collecting system of left kidney. No ureteral stones or findings to suggest urinary tract obstruction.  Micro Data:  SARS coronavirus negative  Antimicrobials:  None  Interim history/subjective:  Reports feeling better no acute distress  Objective   Blood pressure (!) 133/93, pulse 69, temperature 97.8 F (36.6 C), resp. rate 16, height 6\' 2"  (1.88 m), weight 74.9 kg, SpO2 99 %.        Intake/Output Summary (Last 24 hours) at 07/22/2019 1113 Last data filed at 07/21/2019 1743 Gross per 24 hour  Intake 1080 ml  Output --  Net 1080 ml   Filed Weights   07/19/19 0007  Weight: 74.9 kg    Examination: 34 year old male no acute distress Lungs are clear to auscultation Heart sounds are regular Abdomen soft nontender Extremities warm dry without edema Neurologically grossly intact  Resolved Hospital Problem list     Assessment & Plan:  Abnormal CT scan of the chest showing groundglass changes, peribronchovascular prominence, no adenopathy He has a follow-up appointment with Dr. 31 is warmer ILD specialist Further work-up performed at that time Discharge at this time is okay with pulmonary    Layne Benton Angelika Jerrett ACNP Acute Care Nurse Practitioner [facility] Please consult Amion 07/22/2019, 11:13 AM

## 2019-07-22 NOTE — Plan of Care (Signed)
°  Problem: Education: Goal: Knowledge of disease and its progression will improve 07/22/2019 1442 by Netta Cedars D, LPN Outcome: Adequate for Discharge 07/22/2019 1441 by Mercy Riding, LPN Outcome: Adequate for Discharge   Problem: Health Behavior/Discharge Planning: Goal: Ability to manage health-related needs will improve 07/22/2019 1442 by Netta Cedars D, LPN Outcome: Adequate for Discharge 07/22/2019 1441 by Netta Cedars D, LPN Outcome: Adequate for Discharge   Problem: Fluid Volume: Goal: Fluid volume balance will be maintained or improved 07/22/2019 1442 by Netta Cedars D, LPN Outcome: Adequate for Discharge 07/22/2019 1441 by Netta Cedars D, LPN Outcome: Adequate for Discharge   Problem: Nutritional: Goal: Ability to make appropriate dietary choices will improve 07/22/2019 1442 by Netta Cedars D, LPN Outcome: Adequate for Discharge 07/22/2019 1441 by Netta Cedars D, LPN Outcome: Adequate for Discharge   Problem: Urinary Elimination: Goal: Progression of disease will be identified and treated 07/22/2019 1442 by Netta Cedars D, LPN Outcome: Adequate for Discharge 07/22/2019 1441 by Mercy Riding, LPN Outcome: Adequate for Discharge

## 2019-07-24 LAB — IMMUNOFIXATION ELECTROPHORESIS
IgA: 432 mg/dL — ABNORMAL HIGH (ref 90–386)
IgG (Immunoglobin G), Serum: 3001 mg/dL — ABNORMAL HIGH (ref 603–1613)
IgM (Immunoglobulin M), Srm: 121 mg/dL (ref 20–172)
Total Protein ELP: 8.5 g/dL (ref 6.0–8.5)

## 2019-07-24 LAB — ENA+DNA/DS+ANTICH+CENTRO+JO...
Anti JO-1: <0.2 AI (ref 0.0–0.9)
Centromere Ab Screen: <0.2 AI (ref 0.0–0.9)
Chromatin Ab SerPl-aCnc: 0.2 AI (ref 0.0–0.9)
ENA SM Ab Ser-aCnc: <0.2 AI (ref 0.0–0.9)
Ribonucleic Protein: 4.3 AI — ABNORMAL HIGH (ref 0.0–0.9)
SSA (Ro) (ENA) Antibody, IgG: <0.2 AI (ref 0.0–0.9)
SSB (La) (ENA) Antibody, IgG: <0.2 AI (ref 0.0–0.9)
Scleroderma (Scl-70) (ENA) Antibody, IgG: <0.2 AI (ref 0.0–0.9)
ds DNA Ab: 1 IU/mL (ref 0–9)

## 2019-07-24 LAB — ANA W/REFLEX IF POSITIVE: Anti Nuclear Antibody (ANA): POSITIVE — AB

## 2019-08-12 ENCOUNTER — Ambulatory Visit: Payer: Self-pay | Admitting: Acute Care

## 2019-08-24 ENCOUNTER — Ambulatory Visit (INDEPENDENT_AMBULATORY_CARE_PROVIDER_SITE_OTHER): Payer: Self-pay | Admitting: Acute Care

## 2019-08-24 ENCOUNTER — Other Ambulatory Visit: Payer: Self-pay

## 2019-08-24 ENCOUNTER — Encounter: Payer: Self-pay | Admitting: Acute Care

## 2019-08-24 DIAGNOSIS — R918 Other nonspecific abnormal finding of lung field: Secondary | ICD-10-CM

## 2019-08-24 DIAGNOSIS — R933 Abnormal findings on diagnostic imaging of other parts of digestive tract: Secondary | ICD-10-CM

## 2019-08-24 DIAGNOSIS — R7989 Other specified abnormal findings of blood chemistry: Secondary | ICD-10-CM

## 2019-08-24 LAB — COMPREHENSIVE METABOLIC PANEL
ALT: 20 U/L (ref 0–53)
AST: 20 U/L (ref 0–37)
Albumin: 4.7 g/dL (ref 3.5–5.2)
Alkaline Phosphatase: 192 U/L — ABNORMAL HIGH (ref 39–117)
BUN: 11 mg/dL (ref 6–23)
CO2: 22 mEq/L (ref 19–32)
Calcium: 10.5 mg/dL (ref 8.4–10.5)
Chloride: 105 mEq/L (ref 96–112)
Creatinine, Ser: 1.46 mg/dL (ref 0.40–1.50)
GFR: 54.78 mL/min — ABNORMAL LOW (ref >60.00)
Glucose, Bld: 85 mg/dL (ref 70–99)
Potassium: 3.6 mEq/L (ref 3.5–5.1)
Sodium: 133 mEq/L — ABNORMAL LOW (ref 135–145)
Total Bilirubin: 0.5 mg/dL (ref 0.2–1.2)
Total Protein: 9.8 g/dL — ABNORMAL HIGH (ref 6.0–8.3)

## 2019-08-24 NOTE — Progress Notes (Signed)
History of Present Illness Phillip Kim is a 34 y.o. male never smoker with HTN, Kidney disfunction and hypercalcemia with recent ED visit for headaches, nausea and vomiting with   and incidental findings on CT that are concerning for ILD. (CT scan chest findings showing thickening of the peribronchovascular interstitium, micronodularity in the lungs),  He was seen as an inpatient by Dr. Wynona Neat.    08/24/2019 Hospital Follow up for Abnormal CT Chest. Pt. Presents for follow up. He was seen in the ED ( with admission )  6/18  for nausea, vomiting and headaches. Incidental findings on CT Chest concerning for ILD vs sarcoidosis in the context of hypercalcemia and renal dysfunction. ( Creatinine was 2.37 on admission and climbed to 2.45) . He was admitted 6/18-6/21. He was treated and released with follow up in the pulmonary office to better work up his abnormal CT scan .  He is here to Follow up today with his wife who is a Sales executive , and is assisting in his history.  Pt. States he is feeling much better.  He states he does still have a dry cough, but that it is better, and it is more common  at night. We discussed his work history.  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 He has done some painting in the past. He has worked in Holiday representative in the past also. He recently remodeled his home , which involved some dry walling without a mask, , and exposure to dust. He denies a significant history of dyspnea or shortness of breath. Pt. Does have a significant Family history of auto immune disease in his grandfather, his father's sister, and his paternal cousins. These include RA, Sjogerns, Ulcerative cholitis. psoriatic arthritis.Several of his Auto Immune markers are positive.   . Will order , CMET, HRCT, PFT's  Dr. Isaiah Serge to review with them 8/4 and determine if there is a a need for bronch/ further work up and treatment options..     Test Results: 07/19/2019 CT scan  of the chest IMPRESSION: 1. No suspicious osseous lesions to suggest either primary bone malignancy or metastatic disease to the bones. 2. Highly unusual appearance of the lungs, as detailed above, concerning for potential interstitial lung disease. Outpatient referral to Pulmonology for further evaluation is recommended, with consideration for further evaluation with high-resolution chest CT if clinically appropriate. 3. Splenomegaly. 4. Nonobstructive calculi in the collecting systems of both kidneys measuring up to 4 mm in the lower pole collecting system of left kidney. No ureteral stones or findings to suggest urinary tract obstruction.    Labs 06/2019 ACE>> 161 ANA Positive C3 Complement>> 170 Ribonucleic Protein>> 4.0 SSA >> Negative AFB, serum Tumor marker>> 2.2 ANCA Proteinase>> 9.0 Vit D, 1.25 Dihydroxy>> 21.09 Kappa Free Light Chains>> 124 Lamda  Free Light Chains>> 7.5 Kappa, Lamda light chain ratio>> 1.64 Total Protein ELP>> 8.7 Gamma Globulin>> 2.8 Globulin, Total >> 5.0 IgG >>> 3001 IgA>> 432 HIV >> Non-reactive GGT>> 80     CBC Latest Ref Rng & Units 07/21/2019 07/20/2019 07/18/2019  WBC 4.0 - 10.5 K/uL 4.9 6.0 5.0  Hemoglobin 13.0 - 17.0 g/dL 11.6(L) 11.7(L) 12.0(L)  Hematocrit 39 - 52 % 34.8(L) 34.7(L) 35.3(L)  Platelets 150 - 400 K/uL 217 218 232    BMP Latest Ref Rng & Units 08/24/2019 07/22/2019 07/21/2019  Glucose 70 - 99 mg/dL 85 88 96  BUN 6 - 23 mg/dL 11 14 13   Creatinine 0.40 - 1.50  mg/dL 7.12 4.58(K) 9.98(P)  Sodium 135 - 145 mEq/L 133(L) 137 137  Potassium 3.5 - 5.1 mEq/L 3.6 3.1(L) 3.5  Chloride 96 - 112 mEq/L 105 110 111  CO2 19 - 32 mEq/L 22 19(L) 17(L)  Calcium 8.4 - 10.5 mg/dL 38.2 11.3(H) 10.9(H)    BNP No results found for: BNP  ProBNP No results found for: PROBNP  PFT No results found for: FEV1PRE, FEV1POST, FVCPRE, FVCPOST, TLC, DLCOUNC, PREFEV1FVCRT, PSTFEV1FVCRT  No results found.   Past medical hx History  reviewed. No pertinent past medical history.   Social History   Tobacco Use  . Smoking status: Never Smoker  . Smokeless tobacco: Never Used  Vaping Use  . Vaping Use: Never used  Substance Use Topics  . Alcohol use: Not Currently    Comment: soc  . Drug use: Not Currently    Mr.Baines reports that he has never smoked. He has never used smokeless tobacco. He reports previous alcohol use. He reports previous drug use.  Tobacco Cessation: Never smoker  Past surgical hx, Family hx, Social hx all reviewed.  Current Outpatient Medications on File Prior to Visit  Medication Sig  . amLODipine (NORVASC) 10 MG tablet Take 0.5 tablets (5 mg total) by mouth 2 (two) times daily.  . calcitonin, salmon, (MIACALCIN/FORTICAL) 200 UNIT/ACT nasal spray Place 1 spray into alternate nostrils daily. (Patient taking differently: Place 1 spray into alternate nostrils 2 (two) times daily. )  . carvedilol (COREG) 6.25 MG tablet Take 1 tablet (6.25 mg total) by mouth 2 (two) times daily with a meal.  . hydrALAZINE (APRESOLINE) 10 MG tablet Take 1 tablet (10 mg total) by mouth 3 (three) times daily.   No current facility-administered medications on file prior to visit.     No Known Allergies  Review Of Systems:  Constitutional:   No  weight loss, night sweats,  Fevers, chills, fatigue, or  lassitude.  HEENT:   No headaches,  Difficulty swallowing,  Tooth/dental problems, or  Sore throat,                No sneezing, itching, ear ache, nasal congestion, post nasal drip,   CV:  No chest pain,  Orthopnea, PND, swelling in lower extremities, anasarca, dizziness, palpitations, syncope.   GI  No heartburn, indigestion, abdominal pain, nausea, vomiting, diarrhea, change in bowel habits, loss of appetite, bloody stools.   Resp: No shortness of breath with exertion or at rest.  No excess mucus, no productive cough,  + non-productive cough,  No coughing up of blood.  No change in color of mucus.  No  wheezing.  No chest wall deformity  Skin: no rash or lesions.  GU: no dysuria, change in color of urine, no urgency or frequency.  No flank pain, no hematuria   MS:  No joint pain or swelling.  No decreased range of motion.  No back pain.  Psych:  No change in mood or affect. No depression or anxiety.  No memory loss.   Vital Signs BP 118/82 (BP Location: Right Arm, Cuff Size: Normal)   Pulse 63   Temp (!) 97.5 F (36.4 C) (Oral)   Ht 6\' 2"  (1.88 m)   Wt 168 lb 3.2 oz (76.3 kg)   SpO2 98%   BMI 21.60 kg/m    Physical Exam:  General- No distress,  A&Ox3, pleasant ENT: No sinus tenderness, TM clear, pale nasal mucosa, no oral exudate,no post nasal drip, no LAN Cardiac: S1, S2, regular rate and  rhythm, no murmur Chest: No wheeze/ rales/ dullness; no accessory muscle use, no nasal flaring, no sternal retractions Abd.: Soft Non-tender, ND, BS +, Body mass index is 21.6 kg/m. Ext: No clubbing cyanosis, edema Neuro:  normal strength, MAE x 4, A&O x 3, appropriate Skin: No rashes, No lesions,warm and dry Psych: normal mood and behavior   Assessment/Plan Abnormal Chest CT>> incidental finding during hospital admission 07/18/2019-07/22/2019 ( For hypertension, hypercalcemia, nausea and vomiting Concerning for ILD/ sarcoid>> Several Positive AI markers Significant Family History of Auto-immune disease Plan We will check some labs today ( CMET) We will order PFT's today ( Please expedite) Pt. Will need Covid Testing Prior. We will order a high resolution CT of the chest.  Follow up with Dr. Isaiah Serge 8/4 as is scheduled to review labs , PFT's and HRCT.  He will discuss findings and any treatments/ additional work up needed.  Follow up with nephrology  Please contact office for sooner follow up if symptoms do not improve or worsen or seek emergency care    This appointment was 30 min long with over 50% of the time in direct face-to-face patient care, assessment, plan of care, and  follow-up.   Bevelyn Ngo, NP 08/24/2019  4:50 PM

## 2019-08-24 NOTE — Patient Instructions (Addendum)
It is good to see you today. We will check some labs today ( CMET) We will order PFT's today ( Please expedite) Pt. Will need Covid Testing Prior. We will order a high resolution CT of the chest.  Follow up with Dr. Isaiah Serge 8/4 as is scheduled to review labs , PFT's and HRCT.  He will discuss findings and any treatments/ additional work up needed.  Follow up with nephrology  Please contact office for sooner follow up if symptoms do not improve or worsen or seek emergency care

## 2019-08-24 NOTE — Progress Notes (Signed)
Please call patient and let him know his creatinine is still elevated, but it is trending down. This is good news. Also, his calcium is 10.5 which is the upper normal limit, this is also good news.  We will go ahead and order the High Resolution CT Chest., so Dr.Mannam will have that to review when you see him August 4th.  Thanks so much

## 2019-09-01 ENCOUNTER — Inpatient Hospital Stay: Payer: Self-pay | Admitting: Pulmonary Disease

## 2019-09-02 ENCOUNTER — Other Ambulatory Visit: Payer: Self-pay

## 2019-09-02 ENCOUNTER — Ambulatory Visit: Payer: Self-pay | Admitting: Pulmonary Disease

## 2019-09-02 ENCOUNTER — Ambulatory Visit (HOSPITAL_BASED_OUTPATIENT_CLINIC_OR_DEPARTMENT_OTHER)
Admission: RE | Admit: 2019-09-02 | Discharge: 2019-09-02 | Disposition: A | Discharge status: Home / Self Care | Payer: Self-pay | Source: Ambulatory Visit | Attending: Acute Care | Admitting: Acute Care

## 2019-09-02 DIAGNOSIS — R933 Abnormal findings on diagnostic imaging of other parts of digestive tract: Secondary | ICD-10-CM | POA: Insufficient documentation

## 2019-09-08 ENCOUNTER — Other Ambulatory Visit (HOSPITAL_COMMUNITY)
Admission: RE | Admit: 2019-09-08 | Discharge: 2019-09-08 | Disposition: A | Discharge status: Home / Self Care | Payer: HRSA Program | Source: Ambulatory Visit | Attending: Pulmonary Disease | Admitting: Pulmonary Disease

## 2019-09-08 DIAGNOSIS — Z20822 Contact with and (suspected) exposure to covid-19: Secondary | ICD-10-CM | POA: Insufficient documentation

## 2019-09-08 DIAGNOSIS — Z01812 Encounter for preprocedural laboratory examination: Secondary | ICD-10-CM | POA: Diagnosis present

## 2019-09-08 LAB — SARS CORONAVIRUS 2 (TAT 6-24 HRS): SARS Coronavirus 2: NEGATIVE

## 2019-09-11 ENCOUNTER — Other Ambulatory Visit: Payer: Self-pay

## 2019-09-11 ENCOUNTER — Ambulatory Visit (INDEPENDENT_AMBULATORY_CARE_PROVIDER_SITE_OTHER): Payer: Self-pay | Admitting: Pulmonary Disease

## 2019-09-11 ENCOUNTER — Telehealth: Payer: Self-pay | Admitting: Acute Care

## 2019-09-11 DIAGNOSIS — R918 Other nonspecific abnormal finding of lung field: Secondary | ICD-10-CM

## 2019-09-11 NOTE — Telephone Encounter (Signed)
Patient contacted, order for BMP placed, patient to have lab drawn in our office on Monday.

## 2019-09-11 NOTE — Telephone Encounter (Signed)
Sure

## 2019-09-11 NOTE — Telephone Encounter (Signed)
Patient has PFT today, requesting order for lab draw, calcium level. History of hypercalcemia, 6/22 10.9, 6/23 11.3, 7/26 10.5. Patient reports history of emesis, better now. Please advise if we can order this lab.  Pt has appt for PFT today but pt's wife requesting him to have labs drawn to check calcium. Calcium level got high and caused him to throw up clear sticky fluid. Pt isn't currently throwing up. Please advise.

## 2019-09-11 NOTE — Progress Notes (Signed)
Full PFT performed today. °

## 2019-09-14 LAB — PULMONARY FUNCTION TEST
DL/VA % pred: 78 %
DL/VA: 3.69 ml/min/mmHg/L
DLCO cor % pred: 72 %
DLCO cor: 26.96 ml/min/mmHg
DLCO unc % pred: 72 %
DLCO unc: 26.96 ml/min/mmHg
FEF 25-75 Post: 4.77 L/sec
FEF 25-75 Pre: 4.48 L/sec
FEF2575-%Change-Post: 6 %
FEF2575-%Pred-Post: 101 %
FEF2575-%Pred-Pre: 95 %
FEV1-%Change-Post: 2 %
FEV1-%Pred-Post: 93 %
FEV1-%Pred-Pre: 91 %
FEV1-Post: 4.65 L
FEV1-Pre: 4.56 L
FEV1FVC-%Change-Post: 3 %
FEV1FVC-%Pred-Pre: 99 %
FEV6-%Change-Post: 0 %
FEV6-%Pred-Post: 91 %
FEV6-%Pred-Pre: 92 %
FEV6-Post: 5.64 L
FEV6-Pre: 5.67 L
FEV6FVC-%Pred-Post: 101 %
FEV6FVC-%Pred-Pre: 101 %
FVC-%Change-Post: 0 %
FVC-%Pred-Post: 90 %
FVC-%Pred-Pre: 90 %
FVC-Post: 5.64 L
FVC-Pre: 5.69 L
Post FEV1/FVC ratio: 83 %
Post FEV6/FVC ratio: 100 %
Pre FEV1/FVC ratio: 80 %
Pre FEV6/FVC Ratio: 100 %
RV % pred: 58 %
RV: 1.17 L
TLC % pred: 88 %
TLC: 6.99 L

## 2019-10-13 ENCOUNTER — Encounter: Payer: Self-pay | Admitting: Pulmonary Disease

## 2019-10-13 ENCOUNTER — Ambulatory Visit (INDEPENDENT_AMBULATORY_CARE_PROVIDER_SITE_OTHER): Payer: Self-pay | Admitting: Pulmonary Disease

## 2019-10-13 ENCOUNTER — Other Ambulatory Visit: Payer: Self-pay

## 2019-10-13 VITALS — BP 112/72 | HR 83 | Temp 98.1°F | Ht 74.0 in | Wt 163.5 lb

## 2019-10-13 DIAGNOSIS — R918 Other nonspecific abnormal finding of lung field: Secondary | ICD-10-CM

## 2019-10-13 NOTE — Addendum Note (Signed)
Addended by: Demetrio Lapping E on: 10/13/2019 03:51 PM   Modules accepted: Orders

## 2019-10-13 NOTE — Progress Notes (Addendum)
Deronte Solis    [id number]    Dec 03, 1985  Primary Care Physician:Ayesha Rumpf, FNP  Referring Physician: Ayesha Rumpf, FNP [address] [place],  [place] [address]  Chief complaint:  Consult for interstitial lung disease  HPI: 34 year old with no significant past medical history admitted in June 2021 with hypertensive emergency, hypercalcemia and acute kidney injury.  Work-up at that time showed diffuse interstitial lung disease with centrilobular nodules.  PCCM consulted at that time.  Treated with IV fluids, antihypertensives He is here for follow-up after getting a high-resolution CT and PFTs as an outpatient  Currently followed by Dr. Signe Colt, nephrology and is being treated with calcitonin, amlodipine, Coreg and hydralazine States that breathing is doing well with no issues  Pets: No pets.  Used to have a dog several years ago.  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  Remodeled his home in 2019 with drywall work and may have been exposed at that time.  No ongoing exposures with mold, hot tub, Jacuzzi.  He possibly has down comforter but does not use it regularly.  It is still in the closet Smoking history: Has an occasional cigar Travel history: No significant travel history Relevant family history: Does have a significant Family history of auto immune disease in his grandfather, his father's sister, and his paternal cousins. These include RA, Sjogerns, Ulcerative cholitis. psoriatic arthritis.   Outpatient Encounter Medications as of 10/13/2019  Medication Sig  . amLODipine (NORVASC) 10 MG tablet Take 0.5 tablets (5 mg total) by mouth 2 (two) times daily.  . calcitonin, salmon, (MIACALCIN/FORTICAL) 200 UNIT/ACT nasal spray Place 1 spray into alternate nostrils daily. (Patient taking differently: Place 1 spray into alternate nostrils 2 (two) times daily. )  . carvedilol (COREG) 6.25 MG  tablet Take 1 tablet (6.25 mg total) by mouth 2 (two) times daily with a meal.  . hydrALAZINE (APRESOLINE) 10 MG tablet Take 1 tablet (10 mg total) by mouth 3 (three) times daily.   No facility-administered encounter medications on file as of 10/13/2019.    Allergies as of 10/13/2019  . (No Known Allergies)    History reviewed. No pertinent past medical history.  Past Surgical History:  Procedure Laterality Date  . WRIST FUSION      History reviewed. No pertinent family history.  Social History   Socioeconomic History  . Marital status: Married    Spouse name: Not on file  . Number of children: Not on file  . Years of education: Not on file  . Highest education level: Not on file  Occupational History  . Not on file  Tobacco Use  . Smoking status: Never Smoker  . Smokeless tobacco: Never Used  Vaping Use  . Vaping Use: Never used  Substance and Sexual Activity  . Alcohol use: Not Currently    Comment: soc  . Drug use: Not Currently  . Sexual activity: Not on file  Other Topics Concern  . Not on file  Social History Narrative  . Not on file   Social Determinants of Health   Financial Resource Strain:   . Difficulty of Paying Living Expenses: Not on file  Food Insecurity:   . Worried About Programme researcher, broadcasting/film/video in the Last Year: Not on file  . Ran Out of Food in the Last Year: Not on file  Transportation Needs:   . Lack of Transportation (Medical): Not  on file  . Lack of Transportation (Non-Medical): Not on file  Physical Activity:   . Days of Exercise per Week: Not on file  . Minutes of Exercise per Session: Not on file  Stress:   . Feeling of Stress : Not on file  Social Connections:   . Frequency of Communication with Friends and Family: Not on file  . Frequency of Social Gatherings with Friends and Family: Not on file  . Attends Religious Services: Not on file  . Active Member of Clubs or Organizations: Not on file  . Attends Banker  Meetings: Not on file  . Marital Status: Not on file  Intimate Partner Violence:   . Fear of Current or Ex-Partner: Not on file  . Emotionally Abused: Not on file  . Physically Abused: Not on file  . Sexually Abused: Not on file    Review of systems: Review of Systems  Constitutional: Negative for fever and chills.  HENT: Negative.   Eyes: Negative for blurred vision.  Respiratory: as per HPI  Cardiovascular: Negative for chest pain and palpitations.  Gastrointestinal: Negative for vomiting, diarrhea, blood per rectum. Genitourinary: Negative for dysuria, urgency, frequency and hematuria.  Musculoskeletal: Negative for myalgias, back pain and joint pain.  Skin: Negative for itching and rash.  Neurological: Negative for dizziness, tremors, focal weakness, seizures and loss of consciousness.  Endo/Heme/Allergies: Negative for environmental allergies.  Psychiatric/Behavioral: Negative for depression, suicidal ideas and hallucinations.  All other systems reviewed and are negative.  Physical Exam: Blood pressure 112/72, pulse 83, temperature 98.1 F (36.7 C), temperature source Oral, height 6\' 2"  (1.88 m), weight 163 lb 8 oz (74.2 kg), SpO2 97 %. Gen:      No acute distress HEENT:  EOMI, sclera anicteric Neck:     No masses; no thyromegaly Lungs:    Clear to auscultation bilaterally; normal respiratory effort CV:         Regular rate and rhythm; no murmurs Abd:      + bowel sounds; soft, non-tender; no palpable masses, no distension Ext:    No edema; adequate peripheral perfusion Skin:      Warm and dry; no rash Neuro: alert and oriented x 3 Psych: normal mood and affect  Data Reviewed: Imaging: CT chest 07/19/2019-septal thickening with patchy areas of bronchiectasis, peribronchovascular nodularity.  Enlarged spleen. High-resolution CT 09/02/2019-centrilobular and perilymphatic nodularity, splenomegaly I have reviewed the images personally.  PFTs: 09/09/2019 FVC 5.69 [90%],  FEV1 4.56 [91%], F/F 80, TLC 6.99 [88%], DLCO 26.96 [72%] Minimal diffusion defect  Labs: 25-hydroxy vitamin D 07/17/2019-21.9 [low] 1, 25 dihydroxy vitamin D 07/17/2019-106 [elevated]  CTD serologies 07/20/2019 ANCA elevated at 9, positive ANA, elevated C3 complement, positive RNP All other CTD serologies including Ro, La, SCL 70-negative ACE level 07/20/2019-161  Assessment:  Evaluation for interstitial lung disease CT scan findings are suggestive of sarcoidosis.  This is supported by elevated ACE level, elevated 1, 25 dihydroxy vitamin D, hypercalcemia. Could have hypersensitivity pneumonitis but no significant exposure history.  Has a strong family history of autoimmune disease with elevated ANCA, ANA, RNP noted but CT scan is not characteristic of CTD ILD or vasculitis Recheck ANA with IFA, hypersensitivity panel and ANCA panel  I have recommended a lung biopsy initially with bronchoscope and transbronchial biopsies, BAL.  He may need a surgical lung biopsy if this is nondiagnostic. Discussed in detail with patient and wife in office today.  He is reluctant to proceed, may be because he does not  have insurance Information about the procedure given to patient he will call us back if he changes his mind In the meantime I will get a follow-up CT scan in 3 months.  Plan/Recommendations: ANA, hypersensitivity panel, ANCA, ILD questionnaire High-res CT in 3 months   Chilton Greathouse MD [facility] 10/13/2019, 3:32 PM  CC: Ayesha Rumpf, FNP

## 2019-10-13 NOTE — Patient Instructions (Addendum)
I have reviewed your imaging and history to date You have some changes on the CT scan of your last study that is unchanged from prior You may have a process called interstitial lung disease caused by autoimmune process, exposures or sarcoidosis.  Our recommendation is to proceed with bronchoscope and lung biopsy for better evaluation You may eventually need surgical lung biopsy  Based on our discussion you do want to do procedure just yet and want to consider it further Please give [place] a call if you want go ahead  In the meantime I will get ANA IFA, hypersensitivity panel, ANCA panel Order follow-up high-resolution CT in 3 months Follow-up in clinic in 3 months.

## 2019-10-15 LAB — ANA,IFA RA DIAG PNL W/RFLX TIT/PATN
Anti Nuclear Antibody (ANA): NEGATIVE
Cyclic Citrullin Peptide Ab: 19 UNITS
Rheumatoid fact SerPl-aCnc: <14 IU/mL (ref <14)

## 2019-10-15 LAB — ANCA SCREEN W REFLEX TITER: ANCA Screen: NEGATIVE

## 2019-10-19 ENCOUNTER — Encounter: Payer: Self-pay | Admitting: *Deleted

## 2019-10-19 LAB — HYPERSENSITIVITY PNEUMONITIS
A. Pullulans Abs: NEGATIVE
A.Fumigatus #1 Abs: NEGATIVE
Micropolyspora faeni, IgG: NEGATIVE
Pigeon Serum Abs: NEGATIVE
Thermoact. Saccharii: NEGATIVE
Thermoactinomyces vulgaris, IgG: NEGATIVE

## 2019-10-19 NOTE — Progress Notes (Signed)
Letter mailed

## 2020-01-04 ENCOUNTER — Ambulatory Visit (HOSPITAL_BASED_OUTPATIENT_CLINIC_OR_DEPARTMENT_OTHER): Payer: Self-pay | Attending: Pulmonary Disease

## 2020-01-04 ENCOUNTER — Other Ambulatory Visit: Payer: Self-pay

## 2020-10-04 IMAGING — CT CT CHEST HIGH RESOLUTION W/O CM
2 of 5 series · 15 of 36 positions shown, 18 images · non-contrast
Comparison: CT chest, 07/19/2019, 08/20/2019

CLINICAL DATA: Evaluate abnormal lungs, possible interstitial lung
disease

EXAM:
CT CHEST WITHOUT CONTRAST
TECHNIQUE: Multidetector CT imaging of the chest was performed following the
standard protocol without intravenous contrast. High resolution
imaging of the lungs, as well as inspiratory and expiratory imaging,
was performed.

[Series 2: thorax · axial · 0.76mm/px · z∈[-360,-40]mm · 12 of 178 slices shown, 15 images]
[im 9/178  mediastinal]
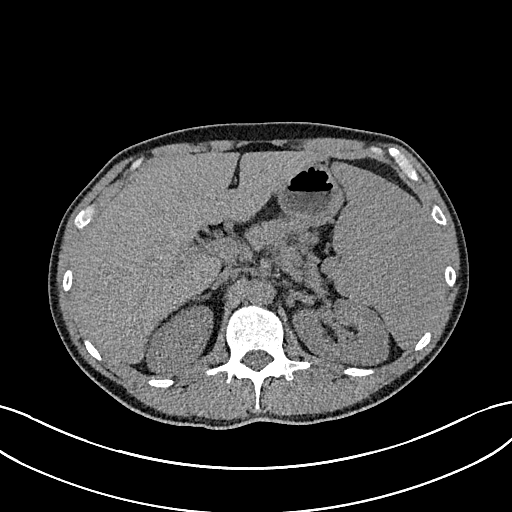
[im 9/178  lung]
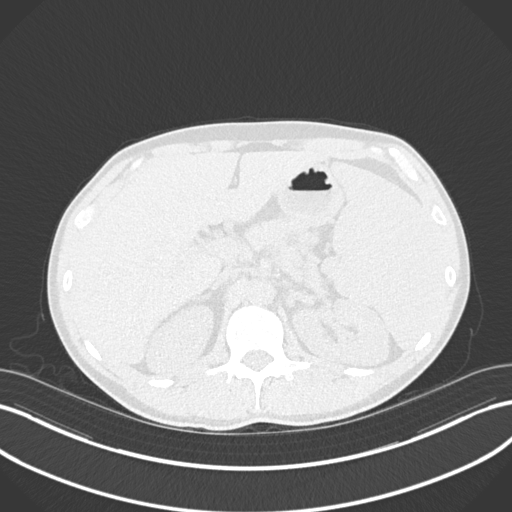
[im 26/178  lung]
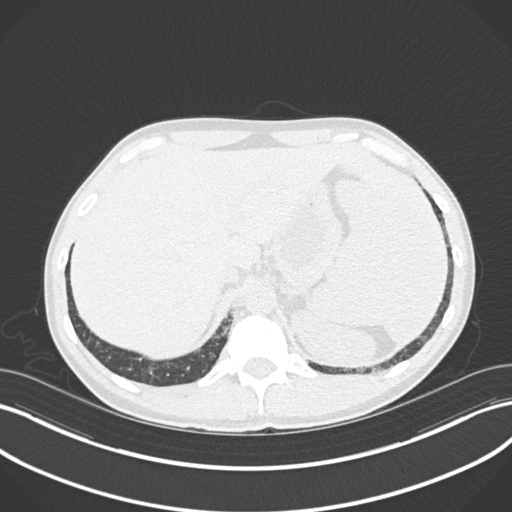
[im 43/178  lung]
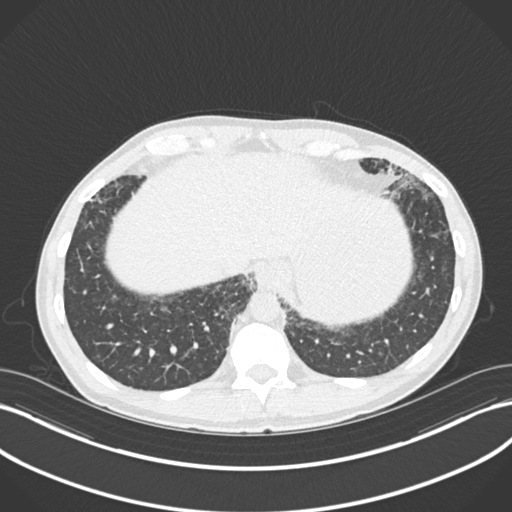
[im 51/178  lung]
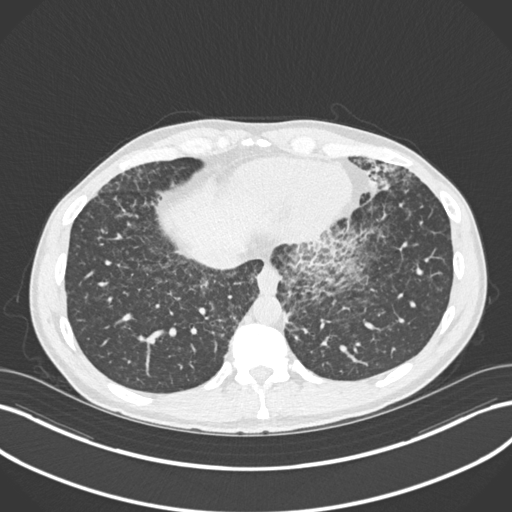
[im 68/178  mediastinal]
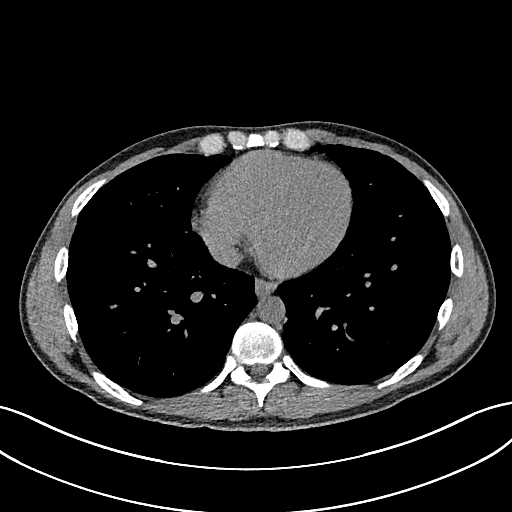
[im 68/178  lung]
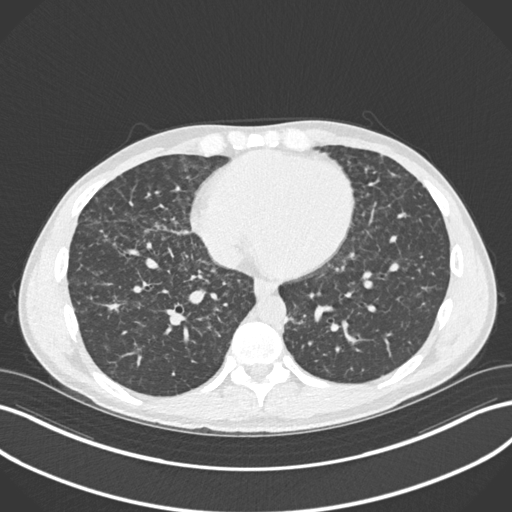
[im 85/178  lung]
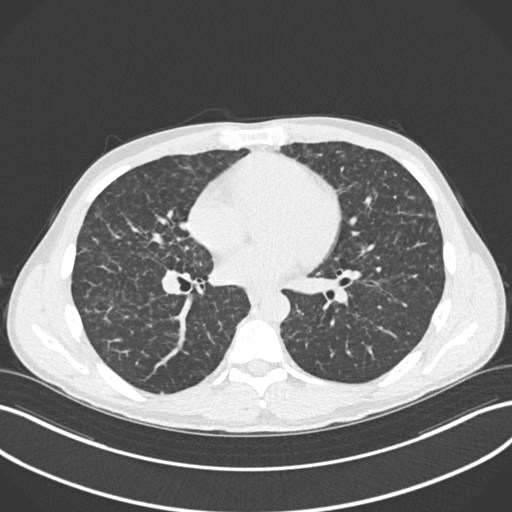
[im 93/178  lung]
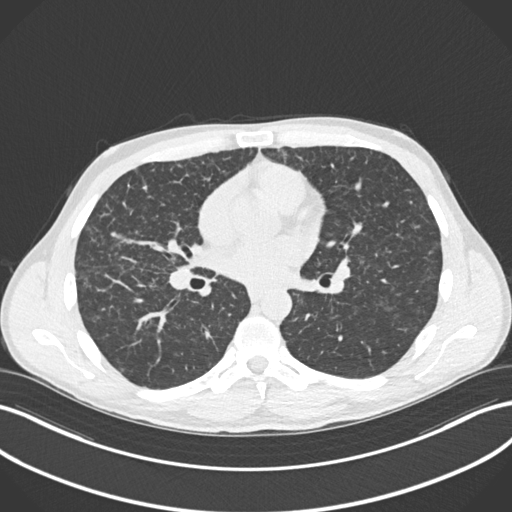
[im 110/178  lung]
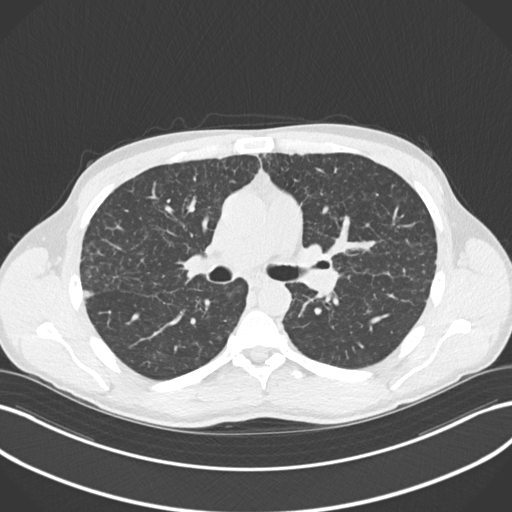
[im 127/178  mediastinal]
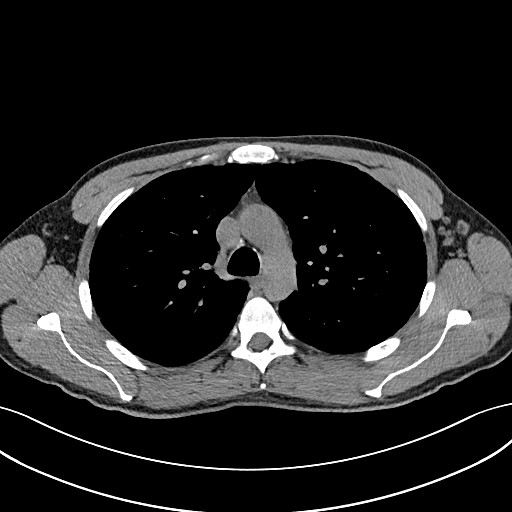
[im 127/178  lung]
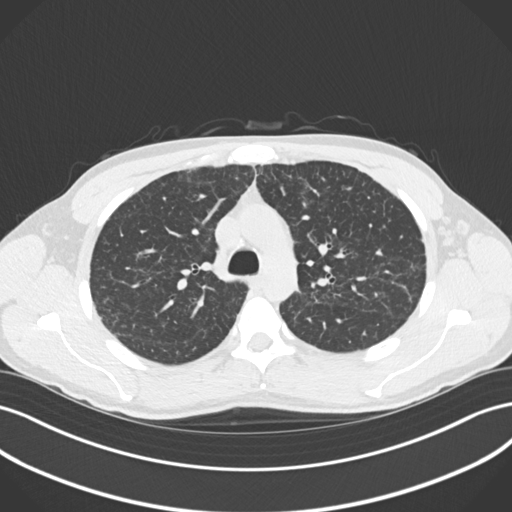
[im 135/178  lung]
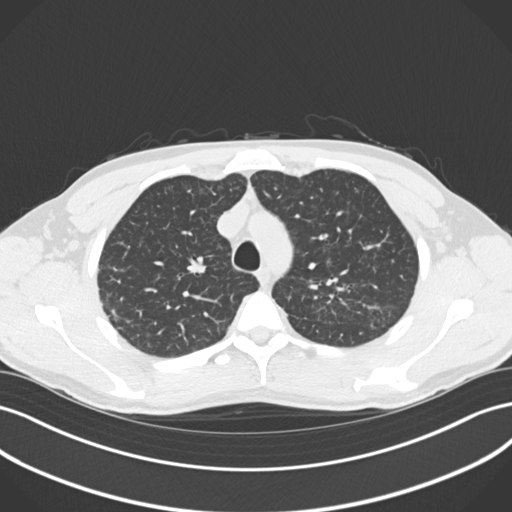
[im 152/178  lung]
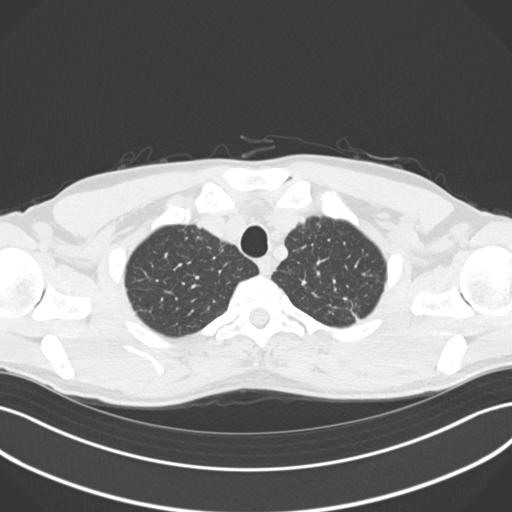
[im 169/178  lung]
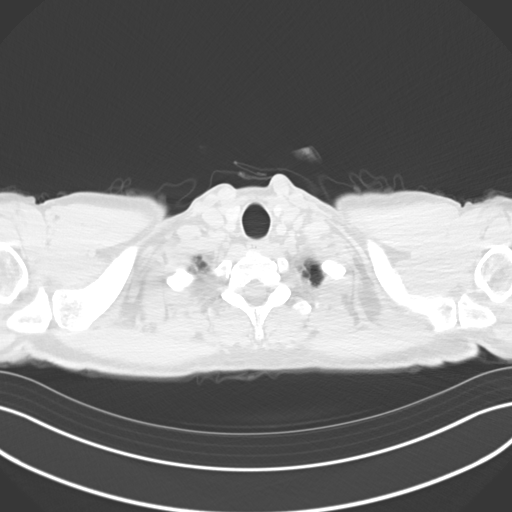

[Series 8: coronal · coronal · 0.72mm/px · 3 of 122 slices shown]
[im 25/122  lung]
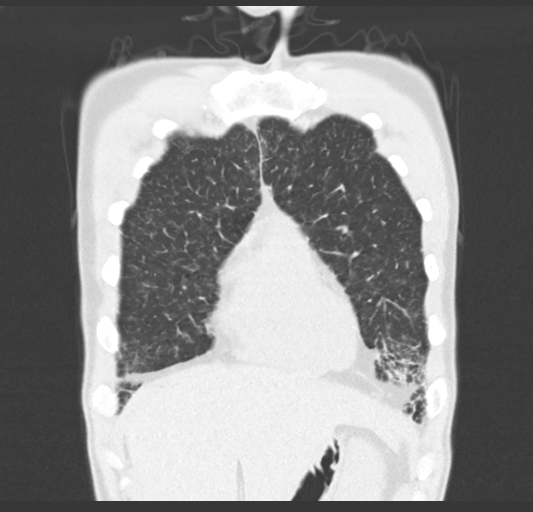
[im 49/122  lung]
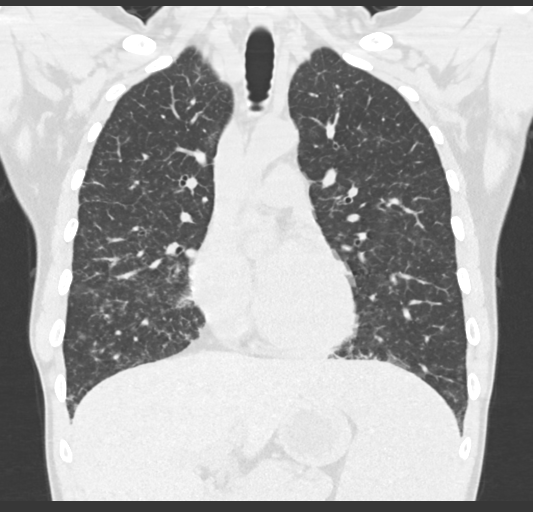
[im 73/122  lung]
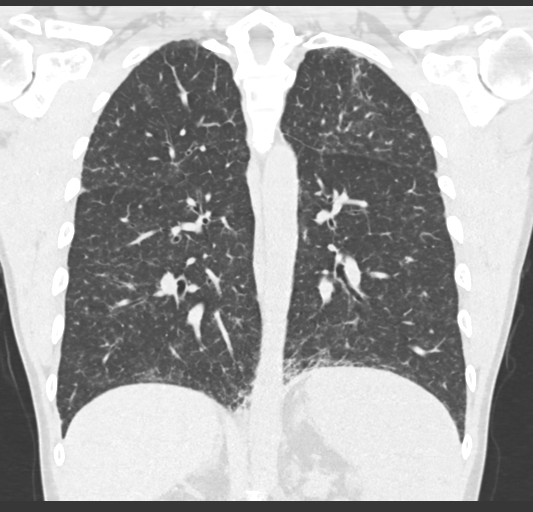

[15 of 36 positions shown; findings below may reference images not displayed]

FINDINGS: Cardiovascular: No significant vascular findings. Normal heart size.
No pericardial effusion.

Mediastinum/Nodes: No enlarged mediastinal, hilar, or axillary lymph
nodes. Thymic remnant in the anterior mediastinum. Thyroid gland,
trachea, and esophagus demonstrate no significant findings.

Lungs/Pleura: There is a redemonstrated, very unusual pattern of
interstitial lung disease featuring very extensive centrilobular and
perilymphatic nodularity throughout the lungs, with perhaps a slight
apical to basal gradient, as well as diffuse interstitial septal
thickening throughout the lungs. There is some associated
ground-glass opacity. There may be some frank fibrotic change at the
lung bases, although not primarily affecting the dependent lung
bases and without significant bronchiectasis, or honeycombing
(series 7, image 128). There is more bandlike appearing scarring of
the posterior left upper lobe (series 7, image 33). No significant
air trapping on expiratory phase imaging. Findings are not
significant changed compared to prior examination dated 07/19/2019
although are new when compared to prior CT dated 08/19/2009 no
pleural effusion or pneumothorax.

Upper Abdomen: No acute abnormality. Nonobstructive small calculi of
the superior pole of the left kidney. Partially imaged splenomegaly.

Musculoskeletal: No chest wall mass or suspicious bone lesions
identified.
IMPRESSION: 1. There is a redemonstrated, very unusual pattern of interstitial
lung disease featuring very extensive centrilobular and
perilymphatic nodularity throughout the lungs, with perhaps a slight
apical to basal gradient, as well as diffuse interstitial septal
thickening throughout the lungs. There may be some frank fibrotic
change at the lung bases, although not primarily affecting the
dependent lung bases and without significant bronchiectasis or
honeycombing. No significant air trapping on expiratory phase
imaging. Findings are not significant changed compared to prior CT
dated 07/19/2019 and remain consistent with an "alternative
diagnosis" pattern of fibrosis by ATS pulmonary fibrosis criteria.
Primary differential considerations include inhalational lung
disease such as hypersensitivity pneumonitis and occupational
pneumoconiosis as well as pulmonary sarcoidosis, although findings
are not truly characteristic for any diagnosis.
2. Nonobstructive left nephrolithiasis.
3. Partially imaged splenomegaly.

## 2021-04-25 ENCOUNTER — Other Ambulatory Visit: Payer: Self-pay

## 2021-04-25 ENCOUNTER — Emergency Department
Admission: RE | Admit: 2021-04-25 | Discharge: 2021-04-25 | Disposition: A | Discharge status: Home / Self Care | Payer: Self-pay | Source: Ambulatory Visit | Attending: Family Medicine | Admitting: Family Medicine

## 2021-04-25 VITALS — BP 135/92 | HR 90 | Temp 98.7°F | Resp 18

## 2021-04-25 DIAGNOSIS — R053 Chronic cough: Secondary | ICD-10-CM

## 2021-04-25 MED ORDER — AZITHROMYCIN 250 MG PO TABS: ORAL_TABLET | ORAL | 0 refills | Status: AC

## 2021-04-25 MED ORDER — BENZONATATE 200 MG PO CAPS: 200.0000 mg | ORAL_CAPSULE | Freq: Three times a day (TID) | ORAL | 0 refills | Status: AC | PRN

## 2021-04-25 MED ORDER — PREDNISONE 20 MG PO TABS: ORAL_TABLET | ORAL | 0 refills | Status: AC

## 2021-04-25 NOTE — ED Provider Notes (Signed)
?[facility] ? ? ? ?CSN: [id number] ?Arrival date & time: 04/25/21  1552 ? ? ?  ? ?History   ?Chief Complaint ?Chief Complaint  ?Patient presents with  ? Cough  ?  x 3 weeks  ? ? ?HPI ?Phillip Kim is a 36 y.o. male.  ? ?HPI ? ?Patient states he has had a cough for 3 weeks.  Initially started with a cold.  Has a persistent cough.  His chest hurts from the coughing.  He states that he feels like it is annoying his coworkers.  He states that he does cough at night.  He is not getting enough sleep.  He has thick white sputum.  No fever or chills.  No shortness of breath.  He also has some postnasal drip.  No suspicion for COVID.  No underlying lung disease ? ?History reviewed. No pertinent past medical history. ? ?Patient Active Problem List  ? Diagnosis Date Noted  ? Hypercalcemia 07/22/2019  ? Hypokalemia 07/22/2019  ? Abnormal CT scan of lung 07/22/2019  ? Chronic diastolic CHF (congestive heart failure) (HCC) 07/22/2019  ? HTN (hypertension) 07/18/2019  ? AKI (acute kidney injury) (HCC) 07/18/2019  ? Hypertensive urgency   ? ? ?Past Surgical History:  ?Procedure Laterality Date  ? WRIST FUSION    ? ? ? ? ? ?Home Medications   ? ?Prior to Admission medications   ?Medication Sig Start Date End Date Taking? Authorizing Provider  ?azithromycin (ZITHROMAX Z-PAK) 250 MG tablet Take two pills today followed by one a day until gone 04/25/21  Yes Eustace Moore, MD  ?benzonatate (TESSALON) 200 MG capsule Take 1 capsule (200 mg total) by mouth 3 (three) times daily as needed for cough. 04/25/21  Yes Eustace Moore, MD  ?predniSONE (DELTASONE) 20 MG tablet Take 40 mg by mouth daily 04/25/21  Yes Eustace Moore, MD  ? ? ?Family History ?History reviewed. No pertinent family history. ? ?Social History ?Social History  ? ?Tobacco Use  ? Smoking status: Never  ? Smokeless tobacco: Never  ?Vaping Use  ? Vaping Use: Never used  ?Substance Use Topics  ? Alcohol use: Not Currently  ?  Comment: soc  ? Drug use:  Not Currently  ? ? ? ?Allergies   ?Patient has no known allergies. ? ? ?Review of Systems ?Review of Systems ?See HPI ? ?Physical Exam ?Triage Vital Signs ?ED Triage Vitals [04/25/21 1607]  ?Enc Vitals Group  ?   BP (!) 135/92  ?   Pulse Rate 90  ?   Resp 18  ?   Temp 98.7 ?F (37.1 ?C)  ?   Temp Source Oral  ?   SpO2 98 %  ?   Weight   ?   Height   ?   Head Circumference   ?   Peak Flow   ?   Pain Score 0  ?   Pain Loc   ?   Pain Edu?   ?   Excl. in GC?   ? ?No data found. ? ?Updated Vital Signs ?BP (!) 135/92 (BP Location: Left Arm)   Pulse 90   Temp 98.7 ?F (37.1 ?C) (Oral)   Resp 18   SpO2 98%  ? ?Physical Exam ?Constitutional:   ?   General: He is not in acute distress. ?   Appearance: Normal appearance. He is well-developed and normal weight.  ?HENT:  ?   Head: Normocephalic and atraumatic.  ?   Right Ear: Tympanic membrane  and ear canal normal.  ?   Left Ear: Tympanic membrane and ear canal normal.  ?   Nose: Nose normal. No rhinorrhea.  ?   Mouth/Throat:  ?   Mouth: Mucous membranes are moist.  ?   Pharynx: No posterior oropharyngeal erythema.  ?Eyes:  ?   Conjunctiva/sclera: Conjunctivae normal.  ?   Pupils: Pupils are equal, round, and reactive to light.  ?Cardiovascular:  ?   Rate and Rhythm: Normal rate and regular rhythm.  ?   Heart sounds: Normal heart sounds.  ?Pulmonary:  ?   Effort: Pulmonary effort is normal. No respiratory distress.  ?   Breath sounds: Rhonchi present.  ?   Comments: Bronchial breath sounds, some rhonchi.  No rales or wheeze ?Chest:  ?   Chest wall: No tenderness.  ?Abdominal:  ?   General: There is no distension.  ?   Palpations: Abdomen is soft.  ?Musculoskeletal:     ?   General: Normal range of motion.  ?   Cervical back: Normal range of motion and neck supple.  ?Lymphadenopathy:  ?   Cervical: No cervical adenopathy.  ?Skin: ?   General: Skin is warm and dry.  ?Neurological:  ?   Mental Status: He is alert.  ?Psychiatric:     ?   Mood and Affect: Mood normal.     ?    Behavior: Behavior normal.  ? ? ? ?UC Treatments / Results  ?Labs ?(all labs ordered are listed, but only abnormal results are displayed) ?Labs Reviewed - No data to display ? ?EKG ? ? ?Radiology ?No results found. ? ?Procedures ?Procedures (including critical care time) ? ?Medications Ordered in UC ?Medications - No data to display ? ?Initial Impression / Assessment and Plan / UC Course  ?I have reviewed the triage vital signs and the nursing notes. ? ?Pertinent labs & imaging results that were available during my care of the patient were reviewed by me and considered in my medical decision making (see chart for details). ? ?  ? ?Final Clinical Impressions(s) / UC Diagnoses  ? ?Final diagnoses:  ?Cough, persistent  ? ? ? ?Discharge Instructions   ? ?  ?Drink lots of water ?Take the antibiotic as directed, azithromycin 2 pills today then once a day until gone ?Take prednisone once a day for 5 days ?Tessalon can be taken 2-3 times a day, it will help with cough ?See your doctor if not improving by next week ? ? ?ED Prescriptions   ? ? Medication Sig Dispense Auth. Provider  ? azithromycin (ZITHROMAX Z-PAK) 250 MG tablet Take two pills today followed by one a day until gone 6 tablet Eustace Moore, MD  ? predniSONE (DELTASONE) 20 MG tablet Take 40 mg by mouth daily 10 tablet Eustace Moore, MD  ? benzonatate (TESSALON) 200 MG capsule Take 1 capsule (200 mg total) by mouth 3 (three) times daily as needed for cough. 21 capsule Eustace Moore, MD  ? ?  ? ?PDMP not reviewed this encounter. ?  ?Eustace Moore, MD ?04/25/21 1713 ? ?

## 2021-04-25 NOTE — Discharge Instructions (Signed)
Drink lots of water ?Take the antibiotic as directed, azithromycin 2 pills today then once a day until gone ?Take prednisone once a day for 5 days ?Tessalon can be taken 2-3 times a day, it will help with cough ?See your doctor if not improving by next week ?

## 2021-04-25 NOTE — ED Triage Notes (Signed)
Pt c/o cough x 3 weeks. Somewhat productive. Worse at night when lying down. Mucinex and tylenol cough and congestion w no relief.  ?
